# Patient Record
Sex: Male | Born: 1950 | Race: White | Hispanic: No | Marital: Married | Smoking: Former smoker
Health system: Southern US, Community
[De-identification: ages and names within clinical notes are randomized; demographics above are authoritative.]

## PROBLEM LIST (undated history)

## (undated) DIAGNOSIS — E785 Hyperlipidemia, unspecified: Secondary | ICD-10-CM

## (undated) DIAGNOSIS — I10 Essential (primary) hypertension: Secondary | ICD-10-CM

## (undated) DIAGNOSIS — F419 Anxiety disorder, unspecified: Secondary | ICD-10-CM

## (undated) DIAGNOSIS — B1920 Unspecified viral hepatitis C without hepatic coma: Secondary | ICD-10-CM

## (undated) HISTORY — DX: Hyperlipidemia, unspecified: E78.5

## (undated) HISTORY — DX: Essential (primary) hypertension: I10

## (undated) HISTORY — DX: Anxiety disorder, unspecified: F41.9

## (undated) HISTORY — DX: Unspecified viral hepatitis C without hepatic coma: B19.20

---

## 2018-01-26 DIAGNOSIS — F419 Anxiety disorder, unspecified: Secondary | ICD-10-CM | POA: Diagnosis not present

## 2018-01-26 DIAGNOSIS — G4700 Insomnia, unspecified: Secondary | ICD-10-CM | POA: Diagnosis not present

## 2018-01-26 DIAGNOSIS — M1611 Unilateral primary osteoarthritis, right hip: Secondary | ICD-10-CM | POA: Diagnosis not present

## 2018-01-26 DIAGNOSIS — I10 Essential (primary) hypertension: Secondary | ICD-10-CM | POA: Diagnosis not present

## 2018-01-28 DIAGNOSIS — R7301 Impaired fasting glucose: Secondary | ICD-10-CM | POA: Diagnosis not present

## 2018-03-09 DIAGNOSIS — E785 Hyperlipidemia, unspecified: Secondary | ICD-10-CM | POA: Diagnosis not present

## 2018-03-18 DIAGNOSIS — E785 Hyperlipidemia, unspecified: Secondary | ICD-10-CM | POA: Diagnosis not present

## 2018-03-18 DIAGNOSIS — I10 Essential (primary) hypertension: Secondary | ICD-10-CM | POA: Diagnosis not present

## 2018-08-02 DIAGNOSIS — G4700 Insomnia, unspecified: Secondary | ICD-10-CM | POA: Diagnosis not present

## 2018-08-02 DIAGNOSIS — E785 Hyperlipidemia, unspecified: Secondary | ICD-10-CM | POA: Diagnosis not present

## 2018-08-02 DIAGNOSIS — I10 Essential (primary) hypertension: Secondary | ICD-10-CM | POA: Diagnosis not present

## 2018-08-02 DIAGNOSIS — F419 Anxiety disorder, unspecified: Secondary | ICD-10-CM | POA: Diagnosis not present

## 2018-09-02 DIAGNOSIS — E785 Hyperlipidemia, unspecified: Secondary | ICD-10-CM | POA: Diagnosis not present

## 2018-09-02 DIAGNOSIS — I10 Essential (primary) hypertension: Secondary | ICD-10-CM | POA: Diagnosis not present

## 2019-01-31 DIAGNOSIS — G4700 Insomnia, unspecified: Secondary | ICD-10-CM | POA: Diagnosis not present

## 2019-01-31 DIAGNOSIS — I10 Essential (primary) hypertension: Secondary | ICD-10-CM | POA: Diagnosis not present

## 2019-01-31 DIAGNOSIS — E785 Hyperlipidemia, unspecified: Secondary | ICD-10-CM | POA: Diagnosis not present

## 2019-01-31 DIAGNOSIS — F419 Anxiety disorder, unspecified: Secondary | ICD-10-CM | POA: Diagnosis not present

## 2019-04-05 DIAGNOSIS — I10 Essential (primary) hypertension: Secondary | ICD-10-CM | POA: Diagnosis not present

## 2019-04-05 DIAGNOSIS — E785 Hyperlipidemia, unspecified: Secondary | ICD-10-CM | POA: Diagnosis not present

## 2019-08-24 DIAGNOSIS — I10 Essential (primary) hypertension: Secondary | ICD-10-CM | POA: Diagnosis not present

## 2019-08-24 DIAGNOSIS — E785 Hyperlipidemia, unspecified: Secondary | ICD-10-CM | POA: Diagnosis not present

## 2019-08-24 DIAGNOSIS — F419 Anxiety disorder, unspecified: Secondary | ICD-10-CM | POA: Diagnosis not present

## 2019-08-24 DIAGNOSIS — G4700 Insomnia, unspecified: Secondary | ICD-10-CM | POA: Diagnosis not present

## 2019-09-26 DIAGNOSIS — M25551 Pain in right hip: Secondary | ICD-10-CM | POA: Diagnosis not present

## 2019-10-04 DIAGNOSIS — E785 Hyperlipidemia, unspecified: Secondary | ICD-10-CM | POA: Diagnosis not present

## 2019-10-04 DIAGNOSIS — I10 Essential (primary) hypertension: Secondary | ICD-10-CM | POA: Diagnosis not present

## 2019-11-02 DIAGNOSIS — E7841 Elevated Lipoprotein(a): Secondary | ICD-10-CM | POA: Diagnosis not present

## 2019-11-02 DIAGNOSIS — I10 Essential (primary) hypertension: Secondary | ICD-10-CM | POA: Diagnosis not present

## 2019-11-02 DIAGNOSIS — E785 Hyperlipidemia, unspecified: Secondary | ICD-10-CM | POA: Diagnosis not present

## 2019-11-02 DIAGNOSIS — M1611 Unilateral primary osteoarthritis, right hip: Secondary | ICD-10-CM | POA: Diagnosis not present

## 2019-12-29 DIAGNOSIS — R079 Chest pain, unspecified: Secondary | ICD-10-CM | POA: Diagnosis not present

## 2020-01-02 ENCOUNTER — Ambulatory Visit: Payer: Self-pay | Admitting: Cardiology

## 2020-01-02 ENCOUNTER — Telehealth: Payer: Self-pay

## 2020-01-06 NOTE — Progress Notes (Signed)
Patient referred by Maude Leriche, PA-C for chest pain  Subjective:   Cory Hunter, male    DOB: Feb 20, 1951, 69 y.o.   MRN: [id number]   Chief Complaint  Patient presents with  . Chest Pain  . New Patient (Initial Visit)     HPI  69 y.o. Caucasian male with hypertension, hyperlipidemia, family h/o CAD, referred for evaluation of chest pain.  Patient works on rebuilding cars. For last week or so, he has noticed exertional chest pain. He was in [place] last week, where he had retrosternal chest pain, jaw and left arm numbness on walking 1/2 mile uphill. He also endorses exertional dyspnea.   He has long standing hip pain, for which he has been taking meloxicam 15 mg daily. He is currently taking Aspirin 325 mg daily. He is stain intolerant, has been on Repatha for past month and tolerating well.    Past Medical History:  Diagnosis Date  . Hyperlipidemia      History reviewed. No pertinent surgical history.   Social History   Tobacco Use  Smoking Status Former Smoker  . Packs/day: 1.00  . Years: 15.00  . Pack years: 15.00  . Types: Cigarettes  Smokeless Tobacco Never Used  Tobacco Comment   Quit in 68s    Social History   Substance and Sexual Activity  Alcohol Use Not Currently     Family History  Problem Relation Age of Onset  . Stroke Father   . Hypertension Father   . Heart attack Father        73s  . Heart attack Sister        9s     Current Outpatient Medications on File Prior to Visit  Medication Sig Dispense Refill  . amLODipine (NORVASC) 10 MG tablet Take 10 mg by mouth daily.    Marland Kitchen aspirin 325 MG tablet Take 325 mg by mouth daily.    Marland Kitchen FLUoxetine (PROZAC) 40 MG capsule Take 40 mg by mouth daily.    Marland Kitchen losartan (COZAAR) 25 MG tablet Take 25 mg by mouth daily.    . meloxicam (MOBIC) 15 MG tablet Take 15 mg by mouth daily.    Marland Kitchen REPATHA 140 MG/ML SOSY USE AS DIRECTED. INJECT ONCE EVERY 2 WEEKS SUBCUTANEOUSLY FOR 30 DAYS    .  zolpidem (AMBIEN) 5 MG tablet Take 5 mg by mouth at bedtime.     No current facility-administered medications on file prior to visit.    Cardiovascular and other pertinent studies:  EKG 01/09/2020: Sinus rhythm 70 bpm.  Probable old inferior infarct.   Recent labs: 10/04/2019: Glucose 99, BUN/Cr 16/0.91. eGFR 83. Na/K 140/4.2. Rest of the CMP normal.  H/H 16.10/46.8 HbA1C 5.7% Chol 262, TG 239, HDL 29, LDL 186   06/03/2017: TSH 1.24 normal    Review of Systems  Cardiovascular: Positive for chest pain and dyspnea on exertion. Negative for leg swelling, palpitations and syncope.  Respiratory: Positive for shortness of breath.   Musculoskeletal: Positive for joint pain (Hip).         Vitals:   01/09/20 1059  BP: 136/88  Pulse: 77  Temp: (!) 97.3 F (36.3 C)  SpO2: 97%     Body mass index is 30.24 kg/m. Filed Weights   01/09/20 1059  Weight: 223 lb (101.2 kg)     Objective:   Physical Exam  Constitutional: He appears well-developed and well-nourished.  Neck: No JVD present.  Cardiovascular: Normal rate, regular rhythm, normal heart sounds and intact  distal pulses.  No murmur heard. Pulmonary/Chest: Effort normal and breath sounds normal. He has no wheezes. He has no rales.  Musculoskeletal:        General: No edema.  Nursing note and vitals reviewed.       Assessment & Recommendations:   69 y.o. Caucasian male with hypertension, hyperlipidemia, family h/o CAD, referred for evaluation of chest pain.  Chest pain: Concering for stable angina. Recommend Aspirin 81 mg, NOT 325 mg. Recommend stopping meloxicam to reduce bleeding risk. Take tylenol instead, for chest pain.  Started Imdur 30 mg daily. Also prescribed SL NTG. Continue amlodipine, losartan. Continue Repatha.  Will obtain echocardiogram and walking/Lexiscan nuclear stress test.  Hyperlipidemia:  Statin intolerance. Continue Repatha.   F/u after above tests.   Nigel Mormon,  MD [facility] [place] Pager: [phone] Office: [phone]

## 2020-01-09 ENCOUNTER — Other Ambulatory Visit: Payer: Self-pay

## 2020-01-09 ENCOUNTER — Encounter: Payer: Self-pay | Admitting: Cardiology

## 2020-01-09 ENCOUNTER — Ambulatory Visit: Payer: Medicare Other | Admitting: Cardiology

## 2020-01-09 VITALS — BP 136/88 | HR 77 | Temp 97.3°F | Ht 72.0 in | Wt 223.0 lb

## 2020-01-09 DIAGNOSIS — I10 Essential (primary) hypertension: Secondary | ICD-10-CM

## 2020-01-09 DIAGNOSIS — R0609 Other forms of dyspnea: Secondary | ICD-10-CM | POA: Diagnosis not present

## 2020-01-09 DIAGNOSIS — Z8249 Family history of ischemic heart disease and other diseases of the circulatory system: Secondary | ICD-10-CM

## 2020-01-09 DIAGNOSIS — I208 Other forms of angina pectoris: Secondary | ICD-10-CM | POA: Insufficient documentation

## 2020-01-09 DIAGNOSIS — R0600 Dyspnea, unspecified: Secondary | ICD-10-CM | POA: Insufficient documentation

## 2020-01-09 DIAGNOSIS — F419 Anxiety disorder, unspecified: Secondary | ICD-10-CM | POA: Insufficient documentation

## 2020-01-09 DIAGNOSIS — Z789 Other specified health status: Secondary | ICD-10-CM | POA: Diagnosis not present

## 2020-01-09 DIAGNOSIS — E782 Mixed hyperlipidemia: Secondary | ICD-10-CM

## 2020-01-09 MED ORDER — ASPIRIN EC 81 MG PO TBEC: 81.0000 mg | DELAYED_RELEASE_TABLET | Freq: Every day | ORAL | 3 refills | Status: DC

## 2020-01-09 MED ORDER — NITROGLYCERIN 0.4 MG SL SUBL: 0.4000 mg | SUBLINGUAL_TABLET | SUBLINGUAL | 3 refills | Status: DC | PRN

## 2020-01-09 MED ORDER — ISOSORBIDE MONONITRATE ER 30 MG PO TB24: 30.0000 mg | ORAL_TABLET | Freq: Every day | ORAL | 3 refills | Status: DC

## 2020-01-10 ENCOUNTER — Telehealth: Payer: Self-pay

## 2020-01-10 NOTE — Telephone Encounter (Signed)
Telephone encounter:  Reason for call: pt's wife called and said that he had a very bad reaction to the Imdur, was shaking and cold sweats and very lathargic  Usual provider: MP  Last office visit: 01/09/20  Next office visit: 01/30/20   Last hospitalization: NA  Current Outpatient Medications on File Prior to Visit  Medication Sig Dispense Refill  . amLODipine (NORVASC) 10 MG tablet Take 10 mg by mouth daily.    Marland Kitchen aspirin EC 81 MG tablet Take 1 tablet (81 mg total) by mouth daily. 30 tablet 3  . FLUoxetine (PROZAC) 40 MG capsule Take 40 mg by mouth daily.    . isosorbide mononitrate (IMDUR) 30 MG 24 hr tablet Take 1 tablet (30 mg total) by mouth daily. 30 tablet 3  . losartan (COZAAR) 25 MG tablet Take 25 mg by mouth daily.    . nitroGLYCERIN (NITROSTAT) 0.4 MG SL tablet Place 1 tablet (0.4 mg total) under the tongue every 5 (five) minutes as needed for chest pain. 30 tablet 3  . REPATHA 140 MG/ML SOSY USE AS DIRECTED. INJECT ONCE EVERY 2 WEEKS SUBCUTANEOUSLY FOR 30 DAYS    . zolpidem (AMBIEN) 5 MG tablet Take 5 mg by mouth at bedtime.     No current facility-administered medications on file prior to visit.

## 2020-01-10 NOTE — Telephone Encounter (Signed)
Ok. Do not take Imdur or sublingual nitroglycerin for now. Will await workup with stress test.  Thanks MJP

## 2020-01-12 DIAGNOSIS — G4700 Insomnia, unspecified: Secondary | ICD-10-CM | POA: Diagnosis not present

## 2020-01-12 DIAGNOSIS — E785 Hyperlipidemia, unspecified: Secondary | ICD-10-CM | POA: Diagnosis not present

## 2020-01-12 DIAGNOSIS — M1611 Unilateral primary osteoarthritis, right hip: Secondary | ICD-10-CM | POA: Diagnosis not present

## 2020-01-12 DIAGNOSIS — E7849 Other hyperlipidemia: Secondary | ICD-10-CM | POA: Diagnosis not present

## 2020-01-16 ENCOUNTER — Ambulatory Visit: Payer: Medicare Other

## 2020-01-16 ENCOUNTER — Other Ambulatory Visit: Payer: Self-pay

## 2020-01-16 DIAGNOSIS — I208 Other forms of angina pectoris: Secondary | ICD-10-CM

## 2020-01-18 NOTE — Progress Notes (Signed)
Thanks

## 2020-01-18 NOTE — Progress Notes (Signed)
I reviewed the stress test. I Please check if he could see me on 4/5, a week earlier.   Thanks MJP

## 2020-01-23 ENCOUNTER — Telehealth: Payer: Medicare Other | Admitting: Cardiology

## 2020-01-23 ENCOUNTER — Encounter: Payer: Self-pay | Admitting: Cardiology

## 2020-01-23 VITALS — BP 157/99 | HR 66

## 2020-01-23 DIAGNOSIS — R0600 Dyspnea, unspecified: Secondary | ICD-10-CM

## 2020-01-23 DIAGNOSIS — I208 Other forms of angina pectoris: Secondary | ICD-10-CM

## 2020-01-23 DIAGNOSIS — R0609 Other forms of dyspnea: Secondary | ICD-10-CM | POA: Diagnosis not present

## 2020-01-23 DIAGNOSIS — Z789 Other specified health status: Secondary | ICD-10-CM | POA: Diagnosis not present

## 2020-01-23 DIAGNOSIS — E782 Mixed hyperlipidemia: Secondary | ICD-10-CM

## 2020-01-23 DIAGNOSIS — Z8249 Family history of ischemic heart disease and other diseases of the circulatory system: Secondary | ICD-10-CM

## 2020-01-23 DIAGNOSIS — I10 Essential (primary) hypertension: Secondary | ICD-10-CM

## 2020-01-23 MED ORDER — METOPROLOL SUCCINATE ER 50 MG PO TB24: 50.0000 mg | ORAL_TABLET | Freq: Every day | ORAL | 2 refills | Status: DC

## 2020-01-23 MED ORDER — PANTOPRAZOLE SODIUM 40 MG PO TBEC: 40.0000 mg | DELAYED_RELEASE_TABLET | Freq: Every day | ORAL | 2 refills | Status: DC

## 2020-01-23 NOTE — Progress Notes (Signed)
Patient referred by Maude Leriche, PA-C for chest pain  Subjective:   Cory Hunter, male    DOB: 08-Jan-1951, 69 y.o.   MRN: [id number]  I connected with the patient on 01/23/2020 by a telephone call and verified that I am speaking with the correct person using two identifiers.     I offered the patient a video enabled application for a virtual visit. Unfortunately, this could not be accomplished due to technical difficulties/lack of video enabled phone/computer. I discussed the limitations of evaluation and management by telemedicine and the availability of in person appointments. The patient expressed understanding and agreed to proceed.   This visit type was conducted due to national recommendations for restrictions regarding the COVID-19 Pandemic (e.g. social distancing).  This format is felt to be most appropriate for this patient at this time.  All issues noted in this document were discussed and addressed.  No physical exam was performed (except for noted visual exam findings with Tele health visits).  The patient has consented to conduct a Tele health visit and understands insurance will be billed.    Chief Complaint  Patient presents with  . Chest Pain     HPI  69 y.o. Caucasian male with hypertension, hyperlipidemia, family h/o CAD, with exertional chest pain.  Since his last visit, he has not had any chest pain or shortness of breath symptoms. 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 He states that this involves physical exertion. He has not done the kind of physical activity that brought the pain in first place- walking up the hill while vacationing in [place].  Stress test showed medium sized, medium severe intensity, completely reversible perfusion defect in apical to mid anterolateral myocardium. In addition, there is a small sized, mild intensity, reversible perfusion defect in basal inferoseptal myocardium. Stress LVEF  52% TID index is 1.20, indicating high risk study.  Patient did not tolerate isosorbide monohydrate due to feeling very lethargic and diaphoretic.   Initial consultation HPI 12/2018: Patient works on rebuilding cars. For last week or so, he has noticed exertional chest pain. He was in [place] last week, where he had retrosternal chest pain, jaw and left arm numbness on walking 1/2 mile uphill. He also endorses exertional dyspnea.   He has long standing hip pain, for which he has been taking meloxicam 15 mg daily. He is currently taking Aspirin 325 mg daily. He is stain intolerant, has been on Repatha for past month and tolerating well.    Current Outpatient Medications on File Prior to Visit  Medication Sig Dispense Refill  . amLODipine (NORVASC) 10 MG tablet Take 10 mg by mouth daily.    Marland Kitchen aspirin EC 81 MG tablet Take 1 tablet (81 mg total) by mouth daily. 30 tablet 3  . FLUoxetine (PROZAC) 40 MG capsule Take 40 mg by mouth daily.    Marland Kitchen losartan (COZAAR) 25 MG tablet Take 25 mg by mouth daily.    Marland Kitchen REPATHA 140 MG/ML SOSY USE AS DIRECTED. INJECT ONCE EVERY 2 WEEKS SUBCUTANEOUSLY FOR 30 DAYS    . zolpidem (AMBIEN) 5 MG tablet Take 5 mg by mouth at bedtime.     No current facility-administered medications on file prior to visit.    Cardiovascular and other pertinent studies:  Echocardiogram 01/16/2020:  Left ventricle cavity is normal in size. Mild concentric hypertrophy of  the left ventricle. Normal global wall motion. Normal LV systolic function  with EF 67%. Doppler evidence of  grade I (impaired) diastolic dysfunction,  normal LAP.  Left atrial cavity is mildly dilated.  Mild (Grade I) mitral regurgitation.  Inadequate TR jet to estimate pulmonary artery systolic pressure. Normal  right atrial pressure.  Lexiscan Tetrofosmin stress test 01/16/2020: No previous exam available for comparison. Lexiscan nuclear stress test performed using 1-day protocol. Stress EKG is non-diagnostic,  as this is pharmacological stress test. In addition, stress EKG showed <1 mm horizontal ST depressions in inferolateral leads.  Medium sized, medium severe intensity, completely reversible perfusion defect in apical to mid anterolateral myocardium. In addition, there is a small sized, mild intensity, reversible perfusion defect in basal inferoseptal myocardium. Stress LVEF 52% TID index is 1.20. High risk study.   EKG 01/09/2020: Sinus rhythm 70 bpm.  Probable old inferior infarct.   Recent labs: 10/04/2019: Glucose 99, BUN/Cr 16/0.91. eGFR 83. Na/K 140/4.2. Rest of the CMP normal.  H/H 16.1/46.8 HbA1C 5.7% Chol 262, TG 239, HDL 29, LDL 186   06/03/2017: TSH 1.24 normal     Review of Systems  Cardiovascular: Positive for chest pain and dyspnea on exertion. Negative for leg swelling, palpitations and syncope.  Respiratory: Positive for shortness of breath.   Musculoskeletal: Positive for joint pain (Hip).         Vitals:   01/23/20 1046  BP: (!) 157/99  Pulse: 66     Objective:   Physical Exam  Not performed. Telephone visit.      Assessment & Recommendations:   69 y.o. Caucasian male with hypertension, hyperlipidemia, family h/o CAD, worsening stable angina.  Stable angina:  While recent stress test showed high risk findings, he has had no recurrence of symptoms since last visit.  Continue aspirin 81 mg daily, amlodipine, Repatha. SL NTG as needed. Switch losartan 25 mg to metoprolol succinate 50 mg for blood pressure control, as well as anti anginal benefits. Recommend taking PPI, as he states that he has to take meloxicam for his hip pain. Should he have worsening chest pain, he knows to contact me sooner. Should he have chest pain not relieved with two SL NTG, hw knows to call 911.  Hyperlipidemia:  Statin intolerance. Continue Repatha.  Check lipid panel.  F/u in June 2021  Nigel Mormon, MD [facility]. [place] Pager:  [phone] Office: [phone]

## 2020-01-30 ENCOUNTER — Telehealth: Payer: Medicare Other | Admitting: Cardiology

## 2020-01-30 DIAGNOSIS — M1611 Unilateral primary osteoarthritis, right hip: Secondary | ICD-10-CM | POA: Diagnosis not present

## 2020-01-30 DIAGNOSIS — E782 Mixed hyperlipidemia: Secondary | ICD-10-CM | POA: Diagnosis not present

## 2020-01-31 LAB — LIPID PANEL
Chol/HDL Ratio: 4.7 ratio (ref 0.0–5.0)
Cholesterol, Total: 126 mg/dL (ref 100–199)
HDL: 27 mg/dL — ABNORMAL LOW (ref >39)
LDL Chol Calc (NIH): 72 mg/dL (ref 0–99)
Triglycerides: 156 mg/dL — ABNORMAL HIGH (ref 0–149)
VLDL Cholesterol Cal: 27 mg/dL (ref 5–40)

## 2020-02-08 ENCOUNTER — Telehealth: Payer: Self-pay

## 2020-02-08 NOTE — Telephone Encounter (Signed)
I took him off his daily meloxicam, as it would increase his risk of GI bleeding, while on Aspirin. Tylenol is the best bet to avoid bleeding risk. If that is not acceptable, okay to take meloxicam for no until he finds an alternate option after discussing with his PCP.   Thanks MJP

## 2020-03-12 DIAGNOSIS — E7841 Elevated Lipoprotein(a): Secondary | ICD-10-CM | POA: Diagnosis not present

## 2020-03-12 DIAGNOSIS — G4700 Insomnia, unspecified: Secondary | ICD-10-CM | POA: Diagnosis not present

## 2020-03-12 DIAGNOSIS — I10 Essential (primary) hypertension: Secondary | ICD-10-CM | POA: Diagnosis not present

## 2020-03-12 DIAGNOSIS — E785 Hyperlipidemia, unspecified: Secondary | ICD-10-CM | POA: Diagnosis not present

## 2020-03-22 NOTE — Progress Notes (Signed)
Follow up visit  Subjective:   Cory Hunter, male    DOB: [birth year], 69 y.o.   MRN: [id number]     HPI   Chief Complaint  Patient presents with  . Chest Pain  . Follow-up    6  week    69 y.o. Caucasian male  hypertension, hyperlipidemia, family h/o CAD with stable angina, abnormal stress test   Patient has not had any recurrent chest pain symptoms on current medical therapy.  That said, his physical activity is significantly limited due to his hip pain.  LDL is improved.  Initial consultation HPI 12/2018: Patient works on rebuilding cars. For last week or so, he has noticed exertional chest pain. He was in [place] last week, where he had retrosternal chest pain, jaw and left arm numbness on walking 1/2 mile uphill. He also endorses exertional dyspnea.   He has long standing hip pain, for which he has been taking meloxicam 15 mg daily. He is currently taking Aspirin 325 mg daily. He is stain intolerant, has been on Repatha for past month and tolerating well.      Current Outpatient Medications on File Prior to Visit  Medication Sig Dispense Refill  . amLODipine (NORVASC) 10 MG tablet Take 10 mg by mouth daily.    Marland Kitchen aspirin EC 81 MG tablet Take 1 tablet (81 mg total) by mouth daily. 30 tablet 3  . FLUoxetine (PROZAC) 40 MG capsule Take 40 mg by mouth daily.    . metoprolol succinate (TOPROL-XL) 50 MG 24 hr tablet Take 1 tablet (50 mg total) by mouth daily. Take with or immediately following a meal. 30 tablet 2  . nitroGLYCERIN (NITROSTAT) 0.4 MG SL tablet Place 0.4 mg under the tongue every 5 (five) minutes as needed for chest pain.    . pantoprazole (PROTONIX) 40 MG tablet Take 1 tablet (40 mg total) by mouth daily. 30 tablet 2  . REPATHA 140 MG/ML SOSY USE AS DIRECTED. INJECT ONCE EVERY 2 WEEKS SUBCUTANEOUSLY FOR 30 DAYS    . zolpidem (AMBIEN) 5 MG tablet Take 5 mg by mouth at bedtime.     No current facility-administered medications on file prior to visit.     Cardiovascular & other pertient studies:  Echocardiogram 01/16/2020:  Left ventricle cavity is normal in size. Mild concentric hypertrophy of the left ventricle.  Normal global wall motion. Normal LV systolic function  with EF 67%. Doppler evidence of grade I (impaired) diastolic dysfunction, normal LAP.  Left atrial cavity is mildly dilated.  Mild (Grade I) mitral regurgitation.  Inadequate TR jet to estimate pulmonary artery systolic pressure. Normal right atrial pressure.   Lexiscan Tetrofosmin stress test 01/16/2020: No previous exam available for comparison. Lexiscan nuclear stress test performed using 1-day protocol.  Stress EKG is non-diagnostic, as this is pharmacological stress test.  In addition, stress EKG showed <1 mm horizontal ST depressions in inferolateral leads.  Medium sized, medium severe intensity, completely reversible perfusion defect in apical to mid anterolateral myocardium.  In addition, there is a small sized, mild intensity, reversible perfusion defect in basal inferoseptal myocardium.  Stress LVEF 52% TID index is 1.20. High risk study.    EKG 01/09/2020: Sinus rhythm 70 bpm.  Probable old inferior infarct.    Recent labs: 4/12/20221: Chol 126, TG 156, HDL 27, LDL 72  10/04/2019: Glucose 99, BUN/Cr 16/0.91. eGFR 83. Na/K 140/4.2. Rest of the CMP normal.  H/H 16.1/46.8 HbA1C 5.7% Chol 262, TG 239, HDL 29, LDL 186  06/03/2017: TSH 1.24 normal     Review of Systems  Cardiovascular: Negative for chest pain, dyspnea on exertion, leg swelling, palpitations and syncope.  Respiratory: Negative for shortness of breath.   Musculoskeletal: Positive for joint pain (Hip).         Vitals:   03/23/20 1021  BP: 131/79  Pulse: 64  Resp: 16  SpO2: 92%    Body mass index is 30.65 kg/m. Filed Weights   03/23/20 1021  Weight: 226 lb (102.5 kg)     Objective:   Physical Exam  Constitutional: No distress.  Neck: No JVD present.   Cardiovascular: Normal rate, regular rhythm, normal heart sounds and intact distal pulses.  No murmur heard. Pulmonary/Chest: Effort normal and breath sounds normal. He has no wheezes. He has no rales.  Musculoskeletal:        General: No edema.  Nursing note and vitals reviewed.         Assessment & Recommendations:    69 y.o. Caucasian male  hypertension, hyperlipidemia, family h/o CAD with stable angina, abnormal stress test   Stable angina:  While recent stress test showed high risk findings, he has had no recurrence of symptoms since last visit.  I suspect this is due to his severely limited physical activity due to hip pain. After detailed discussion regarding further management options, patient is opted to continue medical management without further invasive evaluation.  However, should patient decide to undergo hip surgery in the near future, he will likely need cardiac catheterization for cardiac risk stratification.  I explained to the patient that should he need to undergo revascularization for severe CAD and stable angina, he will likely need at least 3-6 months of uninterrupted dual antiplatelet therapy.  This will likely delay his hip surgery at that time.  Patient and wife verbalized understanding. Continue aspirin 81 mg daily, metoprolol succinate, amlodipine, Repatha. SL NTG as needed.  Hypertension: Well-controlled  Hyperlipidemia:  Statin intolerance. Continue Repatha.  LDL down from 186 to 72  F/u in 6 months    [place], MD [facility] [place] Pager: [phone] Office: [phone]

## 2020-03-23 ENCOUNTER — Encounter: Payer: Self-pay | Admitting: Cardiology

## 2020-03-23 ENCOUNTER — Ambulatory Visit: Payer: Medicare Other | Admitting: Cardiology

## 2020-03-23 ENCOUNTER — Other Ambulatory Visit: Payer: Self-pay

## 2020-03-23 VITALS — BP 131/79 | HR 64 | Resp 16 | Ht 72.0 in | Wt 226.0 lb

## 2020-03-23 DIAGNOSIS — I208 Other forms of angina pectoris: Secondary | ICD-10-CM | POA: Diagnosis not present

## 2020-03-23 DIAGNOSIS — E782 Mixed hyperlipidemia: Secondary | ICD-10-CM | POA: Diagnosis not present

## 2020-03-23 DIAGNOSIS — G4700 Insomnia, unspecified: Secondary | ICD-10-CM | POA: Diagnosis not present

## 2020-03-23 DIAGNOSIS — I10 Essential (primary) hypertension: Secondary | ICD-10-CM | POA: Diagnosis not present

## 2020-03-23 DIAGNOSIS — E785 Hyperlipidemia, unspecified: Secondary | ICD-10-CM | POA: Diagnosis not present

## 2020-03-23 DIAGNOSIS — M1611 Unilateral primary osteoarthritis, right hip: Secondary | ICD-10-CM | POA: Diagnosis not present

## 2020-03-23 MED ORDER — NITROGLYCERIN 0.4 MG SL SUBL: 0.4000 mg | SUBLINGUAL_TABLET | SUBLINGUAL | 3 refills | Status: DC | PRN

## 2020-03-23 MED ORDER — AMLODIPINE BESYLATE 10 MG PO TABS: 10.0000 mg | ORAL_TABLET | Freq: Every day | ORAL | 3 refills | Status: AC

## 2020-03-23 MED ORDER — METOPROLOL SUCCINATE ER 50 MG PO TB24: 50.0000 mg | ORAL_TABLET | Freq: Every day | ORAL | 3 refills | Status: DC

## 2020-03-23 MED ORDER — ASPIRIN EC 81 MG PO TBEC: 81.0000 mg | DELAYED_RELEASE_TABLET | Freq: Every day | ORAL | 3 refills | Status: AC

## 2020-04-17 ENCOUNTER — Other Ambulatory Visit: Payer: Self-pay | Admitting: Cardiology

## 2020-06-04 DIAGNOSIS — I10 Essential (primary) hypertension: Secondary | ICD-10-CM | POA: Diagnosis not present

## 2020-06-04 DIAGNOSIS — M1611 Unilateral primary osteoarthritis, right hip: Secondary | ICD-10-CM | POA: Diagnosis not present

## 2020-06-04 DIAGNOSIS — E7841 Elevated Lipoprotein(a): Secondary | ICD-10-CM | POA: Diagnosis not present

## 2020-06-04 DIAGNOSIS — G4700 Insomnia, unspecified: Secondary | ICD-10-CM | POA: Diagnosis not present

## 2020-07-06 DIAGNOSIS — G4700 Insomnia, unspecified: Secondary | ICD-10-CM | POA: Diagnosis not present

## 2020-07-06 DIAGNOSIS — I10 Essential (primary) hypertension: Secondary | ICD-10-CM | POA: Diagnosis not present

## 2020-07-06 DIAGNOSIS — E7841 Elevated Lipoprotein(a): Secondary | ICD-10-CM | POA: Diagnosis not present

## 2020-07-06 DIAGNOSIS — M1611 Unilateral primary osteoarthritis, right hip: Secondary | ICD-10-CM | POA: Diagnosis not present

## 2020-07-30 DIAGNOSIS — R6889 Other general symptoms and signs: Secondary | ICD-10-CM | POA: Diagnosis not present

## 2020-07-31 DIAGNOSIS — R519 Headache, unspecified: Secondary | ICD-10-CM | POA: Diagnosis not present

## 2020-07-31 DIAGNOSIS — R059 Cough, unspecified: Secondary | ICD-10-CM | POA: Diagnosis not present

## 2020-07-31 DIAGNOSIS — R6889 Other general symptoms and signs: Secondary | ICD-10-CM | POA: Diagnosis not present

## 2020-07-31 DIAGNOSIS — R110 Nausea: Secondary | ICD-10-CM | POA: Diagnosis not present

## 2020-08-14 DIAGNOSIS — M160 Bilateral primary osteoarthritis of hip: Secondary | ICD-10-CM | POA: Diagnosis not present

## 2020-08-14 DIAGNOSIS — M25551 Pain in right hip: Secondary | ICD-10-CM | POA: Diagnosis not present

## 2020-08-30 DIAGNOSIS — Z791 Long term (current) use of non-steroidal anti-inflammatories (NSAID): Secondary | ICD-10-CM | POA: Diagnosis not present

## 2020-08-30 DIAGNOSIS — Z7982 Long term (current) use of aspirin: Secondary | ICD-10-CM | POA: Diagnosis not present

## 2020-08-30 DIAGNOSIS — I10 Essential (primary) hypertension: Secondary | ICD-10-CM | POA: Diagnosis not present

## 2020-08-30 DIAGNOSIS — M1611 Unilateral primary osteoarthritis, right hip: Secondary | ICD-10-CM | POA: Diagnosis not present

## 2020-09-27 ENCOUNTER — Ambulatory Visit: Payer: Medicare Other | Admitting: Cardiology

## 2020-09-27 ENCOUNTER — Encounter: Payer: Self-pay | Admitting: Cardiology

## 2020-09-27 ENCOUNTER — Other Ambulatory Visit: Payer: Self-pay

## 2020-09-27 VITALS — BP 148/90 | HR 66 | Resp 16 | Ht 72.0 in | Wt 229.0 lb

## 2020-09-27 DIAGNOSIS — E782 Mixed hyperlipidemia: Secondary | ICD-10-CM

## 2020-09-27 DIAGNOSIS — Z789 Other specified health status: Secondary | ICD-10-CM

## 2020-09-27 DIAGNOSIS — I10 Essential (primary) hypertension: Secondary | ICD-10-CM

## 2020-09-27 DIAGNOSIS — I208 Other forms of angina pectoris: Secondary | ICD-10-CM

## 2020-09-27 NOTE — Progress Notes (Signed)
Follow up visit  Subjective:   Cory Hunter, male    DOB: [birth year], 69 y.o.   MRN: [id number]   HPI   Chief Complaint  Patient presents with  . Hypertension  . Follow-up    69 y.o. Caucasian male  hypertension, hyperlipidemia, family h/o CAD with stable angina, abnormal stress test   Patient has not had any recurrent chest pain symptoms on current medical therapy.  His physical activity is significantly limited due to his hip pain.  For last few days, he has had left elbow pain with certain movement, not worse with exertion. He is still contemplating undergoing hip surgery at some point. Blood pressure is elevated today. He does not check it regularly.   Initial consultation HPI 12/2018: Patient works on rebuilding cars. For last week or so, he has noticed exertional chest pain. He was in [place] last week, where he had retrosternal chest pain, jaw and left arm numbness on walking 1/2 mile uphill. He also endorses exertional dyspnea.   He has long standing hip pain, for which he has been taking meloxicam 15 mg daily. He is currently taking Aspirin 325 mg daily. He is stain intolerant, has been on Repatha for past month and tolerating well.      Current Outpatient Medications on File Prior to Visit  Medication Sig Dispense Refill  . amLODipine (NORVASC) 10 MG tablet Take 1 tablet (10 mg total) by mouth daily. 90 tablet 3  . aspirin EC 81 MG tablet Take 1 tablet (81 mg total) by mouth daily. 90 tablet 3  . FLUoxetine (PROZAC) 40 MG capsule Take 40 mg by mouth daily.    . metoprolol succinate (TOPROL-XL) 50 MG 24 hr tablet Take 1 tablet (50 mg total) by mouth daily. Take with or immediately following a meal. 90 tablet 3  . nitroGLYCERIN (NITROSTAT) 0.4 MG SL tablet Place 1 tablet (0.4 mg total) under the tongue every 5 (five) minutes as needed for chest pain. 30 tablet 3  . pantoprazole (PROTONIX) 40 MG tablet Take 1 tablet by mouth once daily 90 tablet 3  . REPATHA 140  MG/ML SOSY USE AS DIRECTED. INJECT ONCE EVERY 2 WEEKS SUBCUTANEOUSLY FOR 30 DAYS    . zolpidem (AMBIEN) 5 MG tablet Take 5 mg by mouth at bedtime.     No current facility-administered medications on file prior to visit.    Cardiovascular & other pertient studies:  EKG 09/27/2020: Sinus rhythm 63 bpm Nonspecific T wave changes  Echocardiogram 01/16/2020:  Left ventricle cavity is normal in size. Mild concentric hypertrophy of the left ventricle.  Normal global wall motion. Normal LV systolic function  with EF 67%. Doppler evidence of grade I (impaired) diastolic dysfunction, normal LAP.  Left atrial cavity is mildly dilated.  Mild (Grade I) mitral regurgitation.  Inadequate TR jet to estimate pulmonary artery systolic pressure. Normal right atrial pressure.   Lexiscan Tetrofosmin stress test 01/16/2020: No previous exam available for comparison. Lexiscan nuclear stress test performed using 1-day protocol.  Stress EKG is non-diagnostic, as this is pharmacological stress test.  In addition, stress EKG showed <1 mm horizontal ST depressions in inferolateral leads.  Medium sized, medium severe intensity, completely reversible perfusion defect in apical to mid anterolateral myocardium.  In addition, there is a small sized, mild intensity, reversible perfusion defect in basal inferoseptal myocardium.  Stress LVEF 52% TID index is 1.20. High risk study.    EKG 01/09/2020: Sinus rhythm 70 bpm.  Probable old inferior infarct.  Recent labs: 4/12/20221: Chol 126, TG 156, HDL 27, LDL 72  10/04/2019: Glucose 99, BUN/Cr 16/0.91. eGFR 83. Na/K 140/4.2. Rest of the CMP normal.  H/H 16.1/46.8 HbA1C 5.7% Chol 262, TG 239, HDL 29, LDL 186   06/03/2017: TSH 1.24 normal     Review of Systems  Cardiovascular: Negative for chest pain, dyspnea on exertion, leg swelling, palpitations and syncope.  Respiratory: Negative for shortness of breath.   Musculoskeletal: Positive for joint pain  (Hip).         Vitals:   09/27/20 0923  BP: (!) 148/90  Pulse: 66  Resp: 16  SpO2: 96%     Body mass index is 31.06 kg/m. Filed Weights   09/27/20 0923  Weight: 229 lb (103.9 kg)     Objective:   Physical Exam Vitals and nursing note reviewed.  Constitutional:      General: He is not in acute distress. Neck:     Vascular: No JVD.  Cardiovascular:     Rate and Rhythm: Normal rate and regular rhythm.     Pulses: Intact distal pulses.     Heart sounds: Normal heart sounds. No murmur heard.   Pulmonary:     Effort: Pulmonary effort is normal.     Breath sounds: Normal breath sounds. No wheezing or rales.           Assessment & Recommendations:    69 y.o. Caucasian male  hypertension, hyperlipidemia, family h/o CAD with stable angina, abnormal stress test   CAD with stable angina:  Stress test in 12/2019 showed high risk findings. He currently has no significant angina symptoms. However, his physical activity is limited due to hip pain. I do not think his left elbow pain is angina. After detailed discussion regarding further management options, patient is opted to continue medical management without further invasive evaluation.  However, should patient decide to undergo hip surgery in the near future, he will likely need cardiac catheterization for cardiac risk stratification.  I explained to the patient that should he need to undergo revascularization for severe CAD and stable angina, he will likely need at least 3-6 months of uninterrupted dual antiplatelet therapy.  This will likely delay his hip surgery at that time.  Patient and wife verbalized understanding. Continue aspirin 81 mg daily, metoprolol succinate, amlodipine, Repatha. SL NTG as needed.  Hypertension: BP elevated today. Recommend regular home monitoring and f/u in 6-8 weeks to re-evaluate.  Hyperlipidemia:  Statin intolerance. Continue Repatha.  LDL down from 186 to [place], MD [facility] [place] Pager: [phone] Office: [phone]

## 2020-10-03 ENCOUNTER — Other Ambulatory Visit: Payer: Self-pay | Admitting: Cardiology

## 2020-10-03 DIAGNOSIS — I208 Other forms of angina pectoris: Secondary | ICD-10-CM

## 2020-10-03 DIAGNOSIS — R0609 Other forms of dyspnea: Secondary | ICD-10-CM

## 2020-10-03 DIAGNOSIS — R0600 Dyspnea, unspecified: Secondary | ICD-10-CM

## 2020-10-11 ENCOUNTER — Other Ambulatory Visit (HOSPITAL_COMMUNITY): Payer: Medicare Other

## 2020-10-16 ENCOUNTER — Encounter (HOSPITAL_COMMUNITY): Admission: RE | Payer: Self-pay | Source: Home / Self Care

## 2020-10-16 ENCOUNTER — Ambulatory Visit (HOSPITAL_COMMUNITY): Admission: RE | Admit: 2020-10-16 | Payer: Medicare Other | Source: Home / Self Care | Admitting: Cardiology

## 2020-10-16 SURGERY — LEFT HEART CATH AND CORONARY ANGIOGRAPHY
Anesthesia: LOCAL

## 2020-10-31 DIAGNOSIS — I10 Essential (primary) hypertension: Secondary | ICD-10-CM | POA: Diagnosis not present

## 2020-10-31 DIAGNOSIS — G4700 Insomnia, unspecified: Secondary | ICD-10-CM | POA: Diagnosis not present

## 2020-10-31 DIAGNOSIS — E785 Hyperlipidemia, unspecified: Secondary | ICD-10-CM | POA: Diagnosis not present

## 2020-10-31 DIAGNOSIS — F419 Anxiety disorder, unspecified: Secondary | ICD-10-CM | POA: Diagnosis not present

## 2020-11-22 DIAGNOSIS — F419 Anxiety disorder, unspecified: Secondary | ICD-10-CM | POA: Diagnosis not present

## 2020-11-22 DIAGNOSIS — I10 Essential (primary) hypertension: Secondary | ICD-10-CM | POA: Diagnosis not present

## 2020-11-22 DIAGNOSIS — G4719 Other hypersomnia: Secondary | ICD-10-CM | POA: Diagnosis not present

## 2020-11-22 DIAGNOSIS — E669 Obesity, unspecified: Secondary | ICD-10-CM | POA: Diagnosis not present

## 2020-11-27 NOTE — Progress Notes (Signed)
Follow up visit  Subjective:   Cory Hunter, male    DOB: 1951-05-06, 70 y.o.   MRN: [id number]   HPI   Chief Complaint  Patient presents with  . Hypertension  . Follow-up    2 MONTH    70 y.o. Caucasian male  hypertension, hyperlipidemia, family h/o CAD with stable angina, abnormal stress test   Paitent presents for 2 month follow up of hypertension. At last visit no changes were made to his medications.  Patient has had no recurrence of chest pain symptoms, although physical activity is limited secondary to hip pain.  Unfortunately patient has not been monitoring his blood pressure on a regular basis at home, however the few readings he can provide are above goal. Patient has chosen to go forward with hip surgery, therefore he would like to proceed with cardiac catheterization prior to the surgery.  Patient denies chest pain, palpitations, dyspnea or dizziness, leg swelling.  He denies orthopnea, PND, syncope, near syncope.  Patient admits to diet high in fat and salt.   Current Outpatient Medications on File Prior to Visit  Medication Sig Dispense Refill  . acetaminophen (TYLENOL) 650 MG CR tablet Take 650 mg by mouth daily. 2 TABLETS TWICE DAILY    . amLODipine (NORVASC) 10 MG tablet Take 1 tablet (10 mg total) by mouth daily. 90 tablet 3  . aspirin EC 81 MG tablet Take 1 tablet (81 mg total) by mouth daily. 90 tablet 3  . FLUoxetine (PROZAC) 40 MG capsule Take 40 mg by mouth daily.    . Melatonin 10 MG TABS Take 10 mg by mouth daily.    . nitroGLYCERIN (NITROSTAT) 0.4 MG SL tablet Place 1 tablet (0.4 mg total) under the tongue every 5 (five) minutes as needed for chest pain. 30 tablet 3  . pantoprazole (PROTONIX) 40 MG tablet Take 1 tablet by mouth once daily 90 tablet 3  . REPATHA 140 MG/ML SOSY USE AS DIRECTED. INJECT ONCE EVERY 2 WEEKS SUBCUTANEOUSLY FOR 30 DAYS    . zolpidem (AMBIEN) 5 MG tablet Take 5 mg by mouth at bedtime.    . melatonin 5 MG TABS 1 tablet in the  evening     No current facility-administered medications on file prior to visit.    Cardiovascular & other pertient studies:  EKG 09/27/2020: Sinus rhythm 63 bpm Nonspecific T wave changes  Echocardiogram 01/16/2020:  Left ventricle cavity is normal in size. Mild concentric hypertrophy of the left ventricle.  Normal global wall motion. Normal LV systolic function  with EF 67%. Doppler evidence of grade I (impaired) diastolic dysfunction, normal LAP.  Left atrial cavity is mildly dilated.  Mild (Grade I) mitral regurgitation.  Inadequate TR jet to estimate pulmonary artery systolic pressure. Normal right atrial pressure.   Lexiscan Tetrofosmin stress test 01/16/2020: No previous exam available for comparison. Lexiscan nuclear stress test performed using 1-day protocol.  Stress EKG is non-diagnostic, as this is pharmacological stress test.  In addition, stress EKG showed <1 mm horizontal ST depressions in inferolateral leads.  Medium sized, medium severe intensity, completely reversible perfusion defect in apical to mid anterolateral myocardium.  In addition, there is a small sized, mild intensity, reversible perfusion defect in basal inferoseptal myocardium.  Stress LVEF 52% TID index is 1.20. High risk study.    EKG 01/09/2020: Sinus rhythm 70 bpm.  Probable old inferior infarct.    Recent labs: 10/31/2020: Hemoglobin 16.5, hematocrit 40.1, MCV 97.8, platelet 230 Total cholesterol 163, triglycerides 229, HDL  29, LDL 95, non-HDL 134 Glucose 96, BUN 17, creatinine 1.12, GFR 65, sodium 139, potassium 4.8  4/12/20221: Chol 126, TG 156, HDL 27, LDL 72  10/04/2019: Glucose 99, BUN/Cr 16/0.91. eGFR 83. Na/K 140/4.2. Rest of the CMP normal.  H/H 16.1/46.8 HbA1C 5.7% Chol 262, TG 239, HDL 29, LDL 186   06/03/2017: TSH 1.24 normal     Review of Systems  Constitutional: Negative for malaise/fatigue and weight gain.  Cardiovascular: Negative for chest pain, claudication,  dyspnea on exertion, leg swelling, near-syncope, orthopnea, palpitations, paroxysmal nocturnal dyspnea and syncope.  Respiratory: Negative for shortness of breath.   Hematologic/Lymphatic: Does not bruise/bleed easily.  Musculoskeletal: Positive for joint pain (Hip).  Gastrointestinal: Negative for melena.  Neurological: Negative for dizziness and weakness.          Vitals:   11/28/20 0941  BP: (!) 155/94  Pulse: (!) 59  Resp: 16  Temp: 97.6 F (36.4 C)  SpO2: 97%     Body mass index is 31.71 kg/m. Filed Weights   11/28/20 0941  Weight: 233 lb 12.8 oz (106.1 kg)     Objective:   Physical Exam Vitals and nursing note reviewed.  Constitutional:      General: He is not in acute distress. HENT:     Head: Normocephalic and atraumatic.  Neck:     Vascular: No JVD.  Cardiovascular:     Rate and Rhythm: Normal rate and regular rhythm.     Pulses: Intact distal pulses.     Heart sounds: Normal heart sounds, S1 normal and S2 normal. No murmur heard. No gallop.   Pulmonary:     Effort: Pulmonary effort is normal. No respiratory distress.     Breath sounds: Normal breath sounds. No wheezing, rhonchi or rales.  Musculoskeletal:     Right lower leg: No edema.     Left lower leg: No edema.  Skin:    General: Skin is warm and dry.  Neurological:     Mental Status: He is alert.         Assessment & Recommendations:    70 y.o. Caucasian male  hypertension, hyperlipidemia, family h/o CAD with stable angina, abnormal stress test   CAD with stable angina:  Stress test in 12/2019 showed high risk findings. Patient has no anginal symptoms, although his physical activity is significantly limited secondary to pain in his hip.  Patient had previously opted to continue with medical management of coronary artery disease, however he has decided to proceed with hip surgery and therefore recommend he undergo cardiac catheterization for cardiac risk stratification.  Patient is aware  that if he undergoes revascularization he will need uninterrupted dual antiplatelet therapy for 3 to 6 months, which will further delay his hip surgery. The heart catheterization procedure was explained to the patient in detail. The indication, alternatives, risks and benefits were reviewed. Complications including but not limited to bleeding, infection, acute kidney injury, blood transfusion, heart rhythm disturbances, contrast (dye) reaction, damage to the arteries or nerves in the legs or hands, cerebrovascular accident, myocardial infarction, need for emergent bypass surgery, blood clots in the legs, possible need for emergent blood transfusion, and rarely death were reviewed and discussed with the patient. The patient voices understanding and wishes to proceed. Continue vascular medications including aspirin, metoprolol succinate, amlodipine, Repatha, and as needed sublingual nitroglycerin.  Hypertension: Blood pressure remains uncontrolled as it is elevated in the office as well as on home monitor readings.  We will continue current antihypertensive medications.  We will restart losartan 25 mg daily which she has been on in the past.  Repeat BMP in 7 to 10 days. Patient will monitor his blood pressure at home on a daily basis and bring with him a log to his next office visit.  Hyperlipidemia:  Patient has history of statin intolerance due to significant myalgias.  He is presently tolerating Repatha well. Continue Repatha. I personally reviewed external labs triglycerides are elevated.  Discussed at length regarding diet and lifestyle modifications.  Patient prefers to attempt diet and lifestyle modifications prior to medication adjustments. Will recheck lipid profile in 3 months.   This was a 35-minute encounter with face-to-face counseling, medical records review, coordination of care, explanation of complex medical issues, complex medical decision making.     Alethia Berthold,  PA-C 11/28/2020, 2:15 PM Office: [phone]

## 2020-11-28 ENCOUNTER — Ambulatory Visit: Payer: Medicare Other | Admitting: Student

## 2020-11-28 ENCOUNTER — Other Ambulatory Visit: Payer: Self-pay

## 2020-11-28 ENCOUNTER — Encounter: Payer: Self-pay | Admitting: Student

## 2020-11-28 VITALS — BP 155/94 | HR 59 | Temp 97.6°F | Resp 16 | Ht 72.0 in | Wt 233.8 lb

## 2020-11-28 DIAGNOSIS — I10 Essential (primary) hypertension: Secondary | ICD-10-CM | POA: Diagnosis not present

## 2020-11-28 DIAGNOSIS — E782 Mixed hyperlipidemia: Secondary | ICD-10-CM | POA: Diagnosis not present

## 2020-11-28 DIAGNOSIS — I208 Other forms of angina pectoris: Secondary | ICD-10-CM

## 2020-11-28 DIAGNOSIS — M791 Myalgia, unspecified site: Secondary | ICD-10-CM

## 2020-11-28 DIAGNOSIS — Z789 Other specified health status: Secondary | ICD-10-CM

## 2020-11-28 MED ORDER — METOPROLOL SUCCINATE ER 50 MG PO TB24
50.0000 mg | ORAL_TABLET | Freq: Every day | ORAL | 3 refills | Status: AC
Start: 2020-11-28 — End: 2021-11-23

## 2020-11-28 MED ORDER — LOSARTAN POTASSIUM 25 MG PO TABS: 25.0000 mg | ORAL_TABLET | Freq: Every day | ORAL | 3 refills | Status: DC

## 2020-12-06 DIAGNOSIS — I10 Essential (primary) hypertension: Secondary | ICD-10-CM | POA: Diagnosis not present

## 2020-12-06 DIAGNOSIS — I208 Other forms of angina pectoris: Secondary | ICD-10-CM | POA: Diagnosis not present

## 2020-12-07 DIAGNOSIS — I10 Essential (primary) hypertension: Secondary | ICD-10-CM | POA: Diagnosis not present

## 2020-12-07 DIAGNOSIS — E669 Obesity, unspecified: Secondary | ICD-10-CM | POA: Diagnosis not present

## 2020-12-07 DIAGNOSIS — G4733 Obstructive sleep apnea (adult) (pediatric): Secondary | ICD-10-CM | POA: Diagnosis not present

## 2020-12-07 LAB — BASIC METABOLIC PANEL
BUN/Creatinine Ratio: 15 (ref 10–24)
BUN: 16 mg/dL (ref 8–27)
CO2: 26 mmol/L (ref 20–29)
Calcium: 9.7 mg/dL (ref 8.6–10.2)
Chloride: 100 mmol/L (ref 96–106)
Creatinine, Ser: 1.08 mg/dL (ref 0.76–1.27)
GFR calc Af Amer: 82 mL/min/{1.73_m2} (ref >59)
GFR calc non Af Amer: 71 mL/min/{1.73_m2} (ref >59)
Glucose: 99 mg/dL (ref 65–99)
Potassium: 5.1 mmol/L (ref 3.5–5.2)
Sodium: 140 mmol/L (ref 134–144)

## 2020-12-07 LAB — CBC
Hematocrit: 49.8 % (ref 37.5–51.0)
Hemoglobin: 16.9 g/dL (ref 13.0–17.7)
MCH: 32.7 pg (ref 26.6–33.0)
MCHC: 33.9 g/dL (ref 31.5–35.7)
MCV: 96 fL (ref 79–97)
Platelets: 241 10*3/uL (ref 150–450)
RBC: 5.17 x10E6/uL (ref 4.14–5.80)
RDW: 12.6 % (ref 11.6–15.4)
WBC: 5.2 10*3/uL (ref 3.4–10.8)

## 2020-12-07 LAB — NOVEL CORONAVIRUS, NAA

## 2020-12-07 NOTE — Progress Notes (Signed)
Please inform patient kidney function and electrolytes are stable, continue losartan.

## 2020-12-07 NOTE — Progress Notes (Signed)
Called and spoke with pt regarding lab results. Pt voiced understanding.

## 2020-12-11 DIAGNOSIS — G4733 Obstructive sleep apnea (adult) (pediatric): Secondary | ICD-10-CM | POA: Diagnosis not present

## 2020-12-14 ENCOUNTER — Other Ambulatory Visit (HOSPITAL_COMMUNITY)
Admission: RE | Admit: 2020-12-14 | Discharge: 2020-12-14 | Disposition: A | Discharge status: Home / Self Care | Payer: Medicare Other | Source: Ambulatory Visit | Attending: Cardiology | Admitting: Cardiology

## 2020-12-14 DIAGNOSIS — Z01812 Encounter for preprocedural laboratory examination: Secondary | ICD-10-CM | POA: Diagnosis not present

## 2020-12-14 DIAGNOSIS — Z20822 Contact with and (suspected) exposure to covid-19: Secondary | ICD-10-CM | POA: Insufficient documentation

## 2020-12-14 LAB — SARS CORONAVIRUS 2 (TAT 6-24 HRS): SARS Coronavirus 2: NEGATIVE

## 2020-12-17 DIAGNOSIS — R9439 Abnormal result of other cardiovascular function study: Secondary | ICD-10-CM

## 2020-12-18 ENCOUNTER — Other Ambulatory Visit: Payer: Self-pay

## 2020-12-18 ENCOUNTER — Ambulatory Visit (HOSPITAL_COMMUNITY)
Admission: RE | Admit: 2020-12-18 | Discharge: 2020-12-18 | Disposition: A | Discharge status: Home / Self Care | Payer: Medicare Other | Attending: Cardiology | Admitting: Cardiology

## 2020-12-18 ENCOUNTER — Encounter (HOSPITAL_COMMUNITY)
Admission: RE | Disposition: A | Discharge status: Home / Self Care | Payer: Self-pay | Source: Home / Self Care | Attending: Cardiology

## 2020-12-18 DIAGNOSIS — I25118 Atherosclerotic heart disease of native coronary artery with other forms of angina pectoris: Secondary | ICD-10-CM | POA: Diagnosis not present

## 2020-12-18 DIAGNOSIS — Z8249 Family history of ischemic heart disease and other diseases of the circulatory system: Secondary | ICD-10-CM | POA: Diagnosis not present

## 2020-12-18 DIAGNOSIS — Z79899 Other long term (current) drug therapy: Secondary | ICD-10-CM | POA: Insufficient documentation

## 2020-12-18 DIAGNOSIS — I10 Essential (primary) hypertension: Secondary | ICD-10-CM | POA: Insufficient documentation

## 2020-12-18 DIAGNOSIS — E785 Hyperlipidemia, unspecified: Secondary | ICD-10-CM | POA: Insufficient documentation

## 2020-12-18 DIAGNOSIS — I2584 Coronary atherosclerosis due to calcified coronary lesion: Secondary | ICD-10-CM | POA: Insufficient documentation

## 2020-12-18 DIAGNOSIS — Z7982 Long term (current) use of aspirin: Secondary | ICD-10-CM | POA: Diagnosis not present

## 2020-12-18 DIAGNOSIS — I208 Other forms of angina pectoris: Secondary | ICD-10-CM | POA: Diagnosis present

## 2020-12-18 DIAGNOSIS — R9439 Abnormal result of other cardiovascular function study: Secondary | ICD-10-CM

## 2020-12-18 HISTORY — PX: LEFT HEART CATH AND CORONARY ANGIOGRAPHY: CATH118249

## 2020-12-18 SURGERY — LEFT HEART CATH AND CORONARY ANGIOGRAPHY
Anesthesia: LOCAL

## 2020-12-18 MED ORDER — SODIUM CHLORIDE 0.9 % IV SOLN: 250.0000 mL | INTRAVENOUS | Status: DC | PRN

## 2020-12-18 MED ORDER — MIDAZOLAM HCL 2 MG/2ML IJ SOLN
INTRAMUSCULAR | Status: AC
  Filled 2020-12-18: qty 2

## 2020-12-18 MED ORDER — HYDRALAZINE HCL 20 MG/ML IJ SOLN: 10.0000 mg | INTRAMUSCULAR | Status: DC | PRN

## 2020-12-18 MED ORDER — HEPARIN SODIUM (PORCINE) 1000 UNIT/ML IJ SOLN
INTRAMUSCULAR | Status: AC
  Filled 2020-12-18: qty 1

## 2020-12-18 MED ORDER — VERAPAMIL HCL 2.5 MG/ML IV SOLN
INTRAVENOUS | Status: AC
  Filled 2020-12-18: qty 2

## 2020-12-18 MED ORDER — SODIUM CHLORIDE 0.9 % WEIGHT BASED INFUSION: 1.0000 mL/kg/h | INTRAVENOUS | Status: DC

## 2020-12-18 MED ORDER — ACETAMINOPHEN 325 MG PO TABS: 650.0000 mg | ORAL_TABLET | ORAL | Status: DC | PRN

## 2020-12-18 MED ORDER — ONDANSETRON HCL 4 MG/2ML IJ SOLN: 4.0000 mg | Freq: Four times a day (QID) | INTRAMUSCULAR | Status: DC | PRN

## 2020-12-18 MED ORDER — VERAPAMIL HCL 2.5 MG/ML IV SOLN
INTRAVENOUS | Status: DC | PRN
  Administered 2020-12-18: 10 mL via INTRA_ARTERIAL

## 2020-12-18 MED ORDER — LABETALOL HCL 5 MG/ML IV SOLN: 10.0000 mg | INTRAVENOUS | Status: DC | PRN

## 2020-12-18 MED ORDER — FENTANYL CITRATE (PF) 100 MCG/2ML IJ SOLN
INTRAMUSCULAR | Status: AC
  Filled 2020-12-18: qty 2

## 2020-12-18 MED ORDER — LIDOCAINE HCL (PF) 1 % IJ SOLN
INTRAMUSCULAR | Status: DC | PRN
  Administered 2020-12-18: 3 mL

## 2020-12-18 MED ORDER — HEPARIN (PORCINE) IN NACL 1000-0.9 UT/500ML-% IV SOLN
INTRAVENOUS | Status: AC
  Filled 2020-12-18: qty 1000

## 2020-12-18 MED ORDER — SODIUM CHLORIDE 0.9% FLUSH: 3.0000 mL | INTRAVENOUS | Status: DC | PRN

## 2020-12-18 MED ORDER — IOHEXOL 350 MG/ML SOLN
INTRAVENOUS | Status: DC | PRN
  Administered 2020-12-18: 30 mL

## 2020-12-18 MED ORDER — FENTANYL CITRATE (PF) 100 MCG/2ML IJ SOLN
INTRAMUSCULAR | Status: DC | PRN
  Administered 2020-12-18: 50 ug via INTRAVENOUS

## 2020-12-18 MED ORDER — SODIUM CHLORIDE 0.9 % IV SOLN: INTRAVENOUS | Status: DC

## 2020-12-18 MED ORDER — ASPIRIN 81 MG PO CHEW: 81.0000 mg | CHEWABLE_TABLET | ORAL | Status: DC

## 2020-12-18 MED ORDER — VERAPAMIL HCL 2.5 MG/ML IV SOLN
INTRAVENOUS | Status: DC | PRN
Start: 2020-12-18 — End: 2020-12-18
  Administered 2020-12-18: 2 mg via INTRA_ARTERIAL

## 2020-12-18 MED ORDER — SODIUM CHLORIDE 0.9 % WEIGHT BASED INFUSION
3.0000 mL/kg/h | INTRAVENOUS | Status: AC
  Administered 2020-12-18: 3 mL/kg/h via INTRAVENOUS

## 2020-12-18 MED ORDER — HEPARIN SODIUM (PORCINE) 1000 UNIT/ML IJ SOLN
INTRAMUSCULAR | Status: DC | PRN
  Administered 2020-12-18: 5000 [IU] via INTRAVENOUS

## 2020-12-18 MED ORDER — SODIUM CHLORIDE 0.9% FLUSH: 3.0000 mL | Freq: Two times a day (BID) | INTRAVENOUS | Status: DC

## 2020-12-18 MED ORDER — HEPARIN (PORCINE) IN NACL 1000-0.9 UT/500ML-% IV SOLN
INTRAVENOUS | Status: DC | PRN
  Administered 2020-12-18 (×2): 500 mL

## 2020-12-18 MED ORDER — LIDOCAINE HCL (PF) 1 % IJ SOLN
INTRAMUSCULAR | Status: AC
  Filled 2020-12-18: qty 30

## 2020-12-18 MED ORDER — MIDAZOLAM HCL 2 MG/2ML IJ SOLN
INTRAMUSCULAR | Status: DC | PRN
  Administered 2020-12-18: 1 mg via INTRAVENOUS

## 2020-12-18 SURGICAL SUPPLY — 12 items
CATH INFINITI 5 FR JL3.5 (CATHETERS) ×2 IMPLANT
CATH INFINITI JR4 5F (CATHETERS) ×2 IMPLANT
CATH OPTITORQUE TIG 4.0 5F (CATHETERS) ×2 IMPLANT
DEVICE RAD COMP TR BAND LRG (VASCULAR PRODUCTS) ×2 IMPLANT
GLIDESHEATH SLEND A-KIT 6F 22G (SHEATH) ×2 IMPLANT
GUIDEWIRE INQWIRE 1.5J.035X260 (WIRE) ×1 IMPLANT
INQWIRE 1.5J .035X260CM (WIRE) ×2
KIT HEART LEFT (KITS) ×2 IMPLANT
PACK CARDIAC CATHETERIZATION (CUSTOM PROCEDURE TRAY) ×2 IMPLANT
SYR MEDRAD MARK 7 150ML (SYRINGE) ×2 IMPLANT
TRANSDUCER W/STOPCOCK (MISCELLANEOUS) ×2 IMPLANT
TUBING CIL FLEX 10 FLL-RA (TUBING) ×2 IMPLANT

## 2020-12-18 NOTE — H&P (Signed)
OV 11/28/2020 copied for documentation    Follow up visit  Subjective:   Cory Hunter, male    DOB: [birth year], 70 y.o.   MRN: [id number]   HPI  C/C: Stable angina Abnormal stress test  70 y.o. Caucasian male  hypertension, hyperlipidemia, family h/o CAD with stable angina, abnormal stress test   Paitent presents for 2 month follow up of hypertension. At last visit no changes were made to his medications.  Patient has had no recurrence of chest pain symptoms, although physical activity is limited secondary to hip pain.  Unfortunately patient has not been monitoring his blood pressure on a regular basis at home, however the few readings he can provide are above goal. Patient has chosen to go forward with hip surgery, therefore he would like to proceed with cardiac catheterization prior to the surgery.  Patient denies chest pain, palpitations, dyspnea or dizziness, leg swelling.  He denies orthopnea, PND, syncope, near syncope.  Patient admits to diet high in fat and salt.   No current facility-administered medications on file prior to encounter.   Current Outpatient Medications on File Prior to Encounter  Medication Sig Dispense Refill  . acetaminophen (TYLENOL) 650 MG CR tablet Take 1,300 mg by mouth 2 (two) times daily as needed for pain.    Marland Kitchen amLODipine (NORVASC) 10 MG tablet Take 1 tablet (10 mg total) by mouth daily. 90 tablet 3  . aspirin EC 81 MG tablet Take 1 tablet (81 mg total) by mouth daily. 90 tablet 3  . FLUoxetine (PROZAC) 40 MG capsule Take 40 mg by mouth daily.    Marland Kitchen losartan (COZAAR) 25 MG tablet Take 1 tablet (25 mg total) by mouth daily. 30 tablet 3  . Melatonin 10 MG TABS Take 10 mg by mouth at bedtime.    . metoprolol succinate (TOPROL-XL) 50 MG 24 hr tablet Take 1 tablet (50 mg total) by mouth daily. Take with or immediately following a meal. 90 tablet 3  . pantoprazole (PROTONIX) 40 MG tablet Take 1 tablet by mouth once daily (Patient taking differently: Take 40  mg by mouth daily.) 90 tablet 3  . REPATHA 140 MG/ML SOSY Inject 140 mg into the muscle every 14 (fourteen) days.    Marland Kitchen zolpidem (AMBIEN) 5 MG tablet Take 5 mg by mouth at bedtime.    . diclofenac (VOLTAREN) 75 MG EC tablet Take 75 mg by mouth daily as needed for moderate pain.    . nitroGLYCERIN (NITROSTAT) 0.4 MG SL tablet Place 1 tablet (0.4 mg total) under the tongue every 5 (five) minutes as needed for chest pain. 30 tablet 3    Cardiovascular & other pertient studies:  EKG 09/27/2020: Sinus rhythm 63 bpm Nonspecific T wave changes  Echocardiogram 01/16/2020:  Left ventricle cavity is normal in size. Mild concentric hypertrophy of the left ventricle.  Normal global wall motion. Normal LV systolic function  with EF 67%. Doppler evidence of grade I (impaired) diastolic dysfunction, normal LAP.  Left atrial cavity is mildly dilated.  Mild (Grade I) mitral regurgitation.  Inadequate TR jet to estimate pulmonary artery systolic pressure. Normal right atrial pressure.   Lexiscan Tetrofosmin stress test 01/16/2020: No previous exam available for comparison. Lexiscan nuclear stress test performed using 1-day protocol.  Stress EKG is non-diagnostic, as this is pharmacological stress test.  In addition, stress EKG showed <1 mm horizontal ST depressions in inferolateral leads.  Medium sized, medium severe intensity, completely reversible perfusion defect in apical to mid anterolateral myocardium.  In  addition, there is a small sized, mild intensity, reversible perfusion defect in basal inferoseptal myocardium.  Stress LVEF 52% TID index is 1.20. High risk study.    EKG 01/09/2020: Sinus rhythm 70 bpm.  Probable old inferior infarct.    Recent labs: 10/31/2020: Hemoglobin 16.5, hematocrit 40.1, MCV 97.8, platelet 230 Total cholesterol 163, triglycerides 229, HDL 29, LDL 95, non-HDL 134 Glucose 96, BUN 17, creatinine 1.12, GFR 65, sodium 139, potassium 4.8  4/12/20221: Chol 126, TG  156, HDL 27, LDL 72  10/04/2019: Glucose 99, BUN/Cr 16/0.91. eGFR 83. Na/K 140/4.2. Rest of the CMP normal.  H/H 16.1/46.8 HbA1C 5.7% Chol 262, TG 239, HDL 29, LDL 186   06/03/2017: TSH 1.24 normal     Review of Systems  Constitutional: Negative for malaise/fatigue and weight gain.  Cardiovascular: Negative for chest pain, claudication, dyspnea on exertion, leg swelling, near-syncope, orthopnea, palpitations, paroxysmal nocturnal dyspnea and syncope.  Respiratory: Negative for shortness of breath.   Hematologic/Lymphatic: Does not bruise/bleed easily.  Musculoskeletal: Positive for joint pain (Hip).  Gastrointestinal: Negative for melena.  Neurological: Negative for dizziness and weakness.          Vitals:   12/18/20 0925  BP: (!) 143/93  Pulse: (!) 59  Temp: 97.9 F (36.6 C)  SpO2: 99%     Body mass index is 31.19 kg/m. Filed Weights   12/18/20 0925  Weight: 104.3 kg     Objective:   Physical Exam Vitals and nursing note reviewed.  Constitutional:      General: He is not in acute distress. HENT:     Head: Normocephalic and atraumatic.  Neck:     Vascular: No JVD.  Cardiovascular:     Rate and Rhythm: Normal rate and regular rhythm.     Pulses: Intact distal pulses.     Heart sounds: Normal heart sounds, S1 normal and S2 normal. No murmur heard. No gallop.   Pulmonary:     Effort: Pulmonary effort is normal. No respiratory distress.     Breath sounds: Normal breath sounds. No wheezing, rhonchi or rales.  Musculoskeletal:     Right lower leg: No edema.     Left lower leg: No edema.  Skin:    General: Skin is warm and dry.  Neurological:     Mental Status: He is alert.         Assessment & Recommendations:    70 y.o. Caucasian male  hypertension, hyperlipidemia, family h/o CAD with stable angina, abnormal stress test   CAD with stable angina:  Stress test in 12/2019 showed high risk findings. Patient has no anginal symptoms, although his  physical activity is significantly limited secondary to pain in his hip.  Patient had previously opted to continue with medical management of coronary artery disease, however he has decided to proceed with hip surgery and therefore recommend he undergo cardiac catheterization for cardiac risk stratification.  Patient is aware that if he undergoes revascularization he will need uninterrupted dual antiplatelet therapy for 3 to 6 months, which will further delay his hip surgery. The heart catheterization procedure was explained to the patient in detail. The indication, alternatives, risks and benefits were reviewed. Complications including but not limited to bleeding, infection, acute kidney injury, blood transfusion, heart rhythm disturbances, contrast (dye) reaction, damage to the arteries or nerves in the legs or hands, cerebrovascular accident, myocardial infarction, need for emergent bypass surgery, blood clots in the legs, possible need for emergent blood transfusion, and rarely death were reviewed and  discussed with the patient. The patient voices understanding and wishes to proceed. Continue vascular medications including aspirin, metoprolol succinate, amlodipine, Repatha, and as needed sublingual nitroglycerin.  Hypertension: Blood pressure remains uncontrolled as it is elevated in the office as well as on home monitor readings.  We will continue current antihypertensive medications. We will restart losartan 25 mg daily which she has been on in the past.  Repeat BMP in 7 to 10 days. Patient will monitor his blood pressure at home on a daily basis and bring with him a log to his next office visit.  Hyperlipidemia:  Patient has history of statin intolerance due to significant myalgias.  He is presently tolerating Repatha well. Continue Repatha. I personally reviewed external labs triglycerides are elevated.  Discussed at length regarding diet and lifestyle modifications.  Patient prefers to attempt  diet and lifestyle modifications prior to medication adjustments. Will recheck lipid profile in 3 months.   This was a 35-minute encounter with face-to-face counseling, medical records review, coordination of care, explanation of complex medical issues, complex medical decision making.     [place] Esther Hardy, PA-C 12/18/2020, 12:41 PM Office: [phone]

## 2020-12-18 NOTE — Discharge Instructions (Signed)
Radial Site Care  This sheet gives you information about how to care for yourself after your procedure. Your health care provider may also give you more specific instructions. If you have problems or questions, contact your health care provider. What can I expect after the procedure? After the procedure, it is common to have:  Bruising and tenderness at the catheter insertion area. Follow these instructions at home: Medicines  Take over-the-counter and prescription medicines only as told by your health care provider. Insertion site care  Follow instructions from your health care provider about how to take care of your insertion site. Make sure you: ? Wash your hands with soap and water before you change your bandage (dressing). If soap and water are not available, use hand sanitizer. ? Change your dressing as told by your health care provider. ? Leave stitches (sutures), skin glue, or adhesive strips in place. These skin closures may need to stay in place for 2 weeks or longer. If adhesive strip edges start to loosen and curl up, you may trim the loose edges. Do not remove adhesive strips completely unless your health care provider tells you to do that.  Check your insertion site every day for signs of infection. Check for: ? Redness, swelling, or pain. ? Fluid or blood. ? Pus or a bad smell. ? Warmth.  Do not take baths, swim, or use a hot tub until your health care provider approves.  You may shower 24-48 hours after the procedure, or as directed by your health care provider. ? Remove the dressing and gently wash the site with plain soap and water. ? Pat the area dry with a clean towel. ? Do not rub the site. That could cause bleeding.  Do not apply powder or lotion to the site. Activity  For 24 hours after the procedure, or as directed by your health care provider: ? Do not flex or bend the affected arm. ? Do not push or pull heavy objects with the affected arm. ? Do not drive  yourself home from the hospital or clinic. You may drive 24 hours after the procedure unless your health care provider tells you not to. ? Do not operate machinery or power tools.  Do not lift anything that is heavier than 10 lb (4.5 kg), or the limit that you are told, until your health care provider says that it is safe.  Ask your health care provider when it is okay to: ? Return to work or school. ? Resume usual physical activities or sports. ? Resume sexual activity.   General instructions  If the catheter site starts to bleed, raise your arm and put firm pressure on the site. If the bleeding does not stop, get help right away. This is a medical emergency.  If you went home on the same day as your procedure, a responsible adult should be with you for the first 24 hours after you arrive home.  Keep all follow-up visits as told by your health care provider. This is important. Contact a health care provider if:  You have a fever.  You have redness, swelling, or yellow drainage around your insertion site. Get help right away if:  You have unusual pain at the radial site.  The catheter insertion area swells very fast.  The insertion area is bleeding, and the bleeding does not stop when you hold steady pressure on the area.  Your arm or hand becomes pale, cool, tingly, or numb. These symptoms may represent a serious   problem that is an emergency. Do not wait to see if the symptoms will go away. Get medical help right away. Call your local emergency services (911 in the [place] Do not drive yourself to the hospital. Summary  After the procedure, it is common to have bruising and tenderness at the site.  Follow instructions from your health care provider about how to take care of your radial site wound. Check the wound every day for signs of infection.  Do not lift anything that is heavier than 10 lb (4.5 kg), or the limit that you are told, until your health care provider says that it  is safe. This information is not intended to replace advice given to you by your health care provider. Make sure you discuss any questions you have with your health care provider. Document Revised: 11/11/2017 Document Reviewed: 11/11/2017 Elsevier Patient Education  2021 [employer]  

## 2020-12-18 NOTE — Interval H&P Note (Signed)
History and Physical Interval Note:  12/18/2020 1:12 PM  Cory Hunter  has presented today for surgery, with the diagnosis of hp - cad.  The various methods of treatment have been discussed with the patient and family. After consideration of risks, benefits and other options for treatment, the patient has consented to  Procedure(s): LEFT HEART CATH AND CORONARY ANGIOGRAPHY (N/A) as a surgical intervention.  The patient's history has been reviewed, patient examined, no change in status, stable for surgery.  I have reviewed the patient's chart and labs.  Questions were answered to the patient's satisfaction.    2012 Appropriate Use Criteria for Diagnostic Catheterization Home / Select Test of Interest Desired Test and Scenario Suspected CAD Prior Noninvasive Testing Pharmacological Stress Test CAD Assessment (Coronary Angiography With or Without Left Heart Catheterization and/or Left Ventricu CAD Assessment (Coronary Angiography With or Without Left Heart Catheterization and/or Left Ventriculography) Link Here: [email or web address] Indication:  Suspected CAD (No Prior PCI, No Prior CABG, and No Prior Angiogram Showing > = 50% Angiographic Stenosis) Prior Noninvasive Testing: Stress Test With Imaging (SPECT MPI, Stress Echocardiography, Stress PET, Stress CMR) High-risk findings (e.g., 10% ischemic myocardium on stress SPECT MPI or stress PET, stress-induced wall motion abnormality in 2 or more segments on stress echo or stress CMR) Pretest Symptom Status: Asymptom   Manish J Patwardhan

## 2020-12-19 ENCOUNTER — Encounter (HOSPITAL_COMMUNITY): Payer: Self-pay | Admitting: Cardiology

## 2020-12-24 ENCOUNTER — Other Ambulatory Visit: Payer: Self-pay

## 2020-12-24 ENCOUNTER — Encounter: Payer: Self-pay | Admitting: Cardiology

## 2020-12-24 ENCOUNTER — Ambulatory Visit: Payer: Medicare Other | Admitting: Cardiology

## 2020-12-24 VITALS — BP 130/89 | HR 65 | Temp 97.4°F | Resp 16 | Ht 72.0 in | Wt 233.0 lb

## 2020-12-24 DIAGNOSIS — E782 Mixed hyperlipidemia: Secondary | ICD-10-CM

## 2020-12-24 DIAGNOSIS — I10 Essential (primary) hypertension: Secondary | ICD-10-CM

## 2020-12-24 DIAGNOSIS — Z01810 Encounter for preprocedural cardiovascular examination: Secondary | ICD-10-CM | POA: Diagnosis not present

## 2020-12-24 DIAGNOSIS — I2510 Atherosclerotic heart disease of native coronary artery without angina pectoris: Secondary | ICD-10-CM | POA: Diagnosis not present

## 2020-12-24 NOTE — Progress Notes (Signed)
Called and spoke with pt regarding lab results. Pt voiced understanding.   Follow up visit  Subjective:   Cory Hunter, male    DOB: 12-18-1950, 70 y.o.   MRN: [id number]   HPI   Chief Complaint  Patient presents with  . Coronary Artery Disease  . Hypertension  . Hyperlipidemia  . Follow-up    6 weeks    70 y.o. Caucasian male  hypertension, hyperlipidemia, coronary artery disease   Patient underwent coronary angiogram in 11/2020 that showed severe multivessel coronary artery disease.  Patient denies chest pain at this time.  He continues to have hip pain.  He is hoping to undergo hip surgery in the near future.     Current Outpatient Medications on File Prior to Visit  Medication Sig Dispense Refill  . acetaminophen (TYLENOL) 650 MG CR tablet Take 1,300 mg by mouth 2 (two) times daily as needed for pain.    Marland Kitchen amLODipine (NORVASC) 10 MG tablet Take 1 tablet (10 mg total) by mouth daily. 90 tablet 3  . aspirin EC 81 MG tablet Take 1 tablet (81 mg total) by mouth daily. 90 tablet 3  . diclofenac (VOLTAREN) 75 MG EC tablet Take 75 mg by mouth daily as needed for moderate pain.    Marland Kitchen FLUoxetine (PROZAC) 40 MG capsule Take 40 mg by mouth daily.    Marland Kitchen losartan (COZAAR) 25 MG tablet Take 1 tablet (25 mg total) by mouth daily. 30 tablet 3  . Melatonin 10 MG TABS Take 10 mg by mouth at bedtime.    . metoprolol succinate (TOPROL-XL) 50 MG 24 hr tablet Take 1 tablet (50 mg total) by mouth daily. Take with or immediately following a meal. 90 tablet 3  . nitroGLYCERIN (NITROSTAT) 0.4 MG SL tablet Place 1 tablet (0.4 mg total) under the tongue every 5 (five) minutes as needed for chest pain. 30 tablet 3  . pantoprazole (PROTONIX) 40 MG tablet Take 1 tablet by mouth once daily (Patient taking differently: Take 40 mg by mouth daily.) 90 tablet 3  . REPATHA 140 MG/ML SOSY Inject 140 mg into the muscle every 14 (fourteen) days.    Marland Kitchen zolpidem (AMBIEN) 5 MG tablet Take 5 mg by mouth at bedtime.      No current facility-administered medications on file prior to visit.    Cardiovascular & other pertient studies:  Coronary angiography 12/18/2020: LM: Normal LAD: Ostial 30% stenosis, prox-mid 30% disease, followed by 100% mid occlusion after D2        D2 ostial 30% stenosis LCx: OM2 prox tandem 80% stenoses RCA: Prox mid 10-20% disease         Ostial PDA 30% stenosis         Grade 2 right-to-left collaterals from PDA to LAD, fills up to D3  LVEDP, LVEF normal  Severe two vessel CAD, relatively stable on medical management Will discuss medical therapy vs revascularization options and timing of upcoming hip surgery  EKG 09/27/2020: Sinus rhythm 63 bpm Nonspecific T wave changes  Echocardiogram 01/16/2020:  Left ventricle cavity is normal in size. Mild concentric hypertrophy of the left ventricle.  Normal global wall motion. Normal LV systolic function  with EF 67%. Doppler evidence of grade I (impaired) diastolic dysfunction, normal LAP.  Left atrial cavity is mildly dilated.  Mild (Grade I) mitral regurgitation.  Inadequate TR jet to estimate pulmonary artery systolic pressure. Normal right atrial pressure.  Lexiscan Tetrofosmin stress test 01/16/2020: No previous exam available for comparison. Lexiscan nuclear stress  test performed using 1-day protocol.  Stress EKG is non-diagnostic, as this is pharmacological stress test.  In addition, stress EKG showed <1 mm horizontal ST depressions in inferolateral leads.  Medium sized, medium severe intensity, completely reversible perfusion defect in apical to mid anterolateral myocardium.  In addition, there is a small sized, mild intensity, reversible perfusion defect in basal inferoseptal myocardium.  Stress LVEF 52% TID index is 1.20. High risk study.    Recent labs: 4/12/20221: Chol 126, TG 156, HDL 27, LDL 72  10/04/2019: Glucose 99, BUN/Cr 16/0.91. eGFR 83. Na/K 140/4.2. Rest of the CMP normal.  H/H 16.1/46.8 HbA1C  5.7% Chol 262, TG 239, HDL 29, LDL 186   06/03/2017: TSH 1.24 normal     Review of Systems  Cardiovascular: Negative for chest pain, dyspnea on exertion, leg swelling, palpitations and syncope.  Respiratory: Negative for shortness of breath.   Musculoskeletal: Positive for joint pain (Hip).         Vitals:   12/24/20 1250  BP: 130/89  Pulse: 65  Resp: 16  Temp: (!) 97.4 F (36.3 C)  SpO2: 95%     Body mass index is 31.6 kg/m. Filed Weights   12/24/20 1250  Weight: 233 lb (105.7 kg)     Objective:   Physical Exam Vitals and nursing note reviewed.  Constitutional:      General: He is not in acute distress. Neck:     Vascular: No JVD.  Cardiovascular:     Rate and Rhythm: Normal rate and regular rhythm.     Pulses: Intact distal pulses.     Heart sounds: Normal heart sounds. No murmur heard.   Pulmonary:     Effort: Pulmonary effort is normal.     Breath sounds: Normal breath sounds. No wheezing or rales.           Assessment & Recommendations:   70 y.o. Caucasian male  hypertension, hyperlipidemia, coronary artery disease   Preoperative stratification: Severe multivessel CAD with LAD CTO, severe left circumflex stenoses, right to left collaterals from RCA. At this time, patient's physical activity is limited due to his hip pain.  With his limited physical capacity, he does not have any anginal symptoms.  He is significant lifestyle limiting issue remains his hip pain. While his perioperative cardiac risk is elevated, it is not prohibitive.  In absence of anginal symptoms, I do not think coronary revascularization would reduce his perioperative risk. I suggest that he proceed with hip surgery with elevated but acceptable perioperative cardiac risk. After hip surgery and increased ambulation, I suspect he will have more angina symptoms.  We will consider revascularization at that time. At this time, continue baseline excellent medical therapy,  including aspirin, Repatha, metoprolol, amlodipine.  Hypertension: Controlled  Hyperlipidemia:  Statin intolerance. Continue Repatha.  LDL down from 186 to 72  F/u in 3 months  [place], MD [facility] Pager: [phone] Office: [phone]

## 2021-01-09 ENCOUNTER — Ambulatory Visit: Payer: Medicare Other | Admitting: Cardiology

## 2021-01-09 ENCOUNTER — Ambulatory Visit: Payer: Medicare Other | Admitting: Student

## 2021-02-26 DIAGNOSIS — M1611 Unilateral primary osteoarthritis, right hip: Secondary | ICD-10-CM | POA: Diagnosis not present

## 2021-02-26 DIAGNOSIS — R7309 Other abnormal glucose: Secondary | ICD-10-CM | POA: Diagnosis not present

## 2021-02-26 DIAGNOSIS — M1610 Unilateral primary osteoarthritis, unspecified hip: Secondary | ICD-10-CM | POA: Diagnosis not present

## 2021-03-06 DIAGNOSIS — Z01818 Encounter for other preprocedural examination: Secondary | ICD-10-CM | POA: Diagnosis not present

## 2021-03-06 DIAGNOSIS — Z01810 Encounter for preprocedural cardiovascular examination: Secondary | ICD-10-CM | POA: Diagnosis not present

## 2021-03-06 DIAGNOSIS — M1611 Unilateral primary osteoarthritis, right hip: Secondary | ICD-10-CM | POA: Diagnosis not present

## 2021-03-15 DIAGNOSIS — M1611 Unilateral primary osteoarthritis, right hip: Secondary | ICD-10-CM | POA: Diagnosis not present

## 2021-03-15 DIAGNOSIS — Z471 Aftercare following joint replacement surgery: Secondary | ICD-10-CM | POA: Diagnosis not present

## 2021-03-15 DIAGNOSIS — Z96641 Presence of right artificial hip joint: Secondary | ICD-10-CM | POA: Diagnosis not present

## 2021-03-15 DIAGNOSIS — I10 Essential (primary) hypertension: Secondary | ICD-10-CM | POA: Diagnosis not present

## 2021-03-16 DIAGNOSIS — I10 Essential (primary) hypertension: Secondary | ICD-10-CM | POA: Diagnosis not present

## 2021-03-16 DIAGNOSIS — M1611 Unilateral primary osteoarthritis, right hip: Secondary | ICD-10-CM | POA: Diagnosis not present

## 2021-03-18 DIAGNOSIS — I10 Essential (primary) hypertension: Secondary | ICD-10-CM | POA: Diagnosis not present

## 2021-03-18 DIAGNOSIS — R454 Irritability and anger: Secondary | ICD-10-CM | POA: Diagnosis not present

## 2021-03-18 DIAGNOSIS — I2510 Atherosclerotic heart disease of native coronary artery without angina pectoris: Secondary | ICD-10-CM | POA: Diagnosis not present

## 2021-03-18 DIAGNOSIS — Z96641 Presence of right artificial hip joint: Secondary | ICD-10-CM | POA: Diagnosis not present

## 2021-03-18 DIAGNOSIS — Z87891 Personal history of nicotine dependence: Secondary | ICD-10-CM | POA: Diagnosis not present

## 2021-03-18 DIAGNOSIS — F419 Anxiety disorder, unspecified: Secondary | ICD-10-CM | POA: Diagnosis not present

## 2021-03-18 DIAGNOSIS — Z471 Aftercare following joint replacement surgery: Secondary | ICD-10-CM | POA: Diagnosis not present

## 2021-03-28 DIAGNOSIS — Z96641 Presence of right artificial hip joint: Secondary | ICD-10-CM | POA: Diagnosis not present

## 2021-03-28 DIAGNOSIS — Z471 Aftercare following joint replacement surgery: Secondary | ICD-10-CM | POA: Diagnosis not present

## 2021-03-29 ENCOUNTER — Ambulatory Visit: Payer: Medicare Other | Admitting: Cardiology

## 2021-04-02 DIAGNOSIS — Z789 Other specified health status: Secondary | ICD-10-CM | POA: Diagnosis not present

## 2021-04-02 DIAGNOSIS — Z471 Aftercare following joint replacement surgery: Secondary | ICD-10-CM | POA: Diagnosis not present

## 2021-04-02 DIAGNOSIS — M6281 Muscle weakness (generalized): Secondary | ICD-10-CM | POA: Diagnosis not present

## 2021-04-02 DIAGNOSIS — Z96641 Presence of right artificial hip joint: Secondary | ICD-10-CM | POA: Diagnosis not present

## 2021-04-02 DIAGNOSIS — M1611 Unilateral primary osteoarthritis, right hip: Secondary | ICD-10-CM | POA: Diagnosis not present

## 2021-04-02 DIAGNOSIS — Z7409 Other reduced mobility: Secondary | ICD-10-CM | POA: Diagnosis not present

## 2021-04-03 ENCOUNTER — Ambulatory Visit: Payer: Medicare Other | Admitting: Cardiology

## 2021-04-03 ENCOUNTER — Encounter: Payer: Self-pay | Admitting: Cardiology

## 2021-04-03 VITALS — BP 129/79 | HR 72 | Temp 98.5°F | Resp 16 | Ht 72.0 in | Wt 225.0 lb

## 2021-04-03 DIAGNOSIS — E782 Mixed hyperlipidemia: Secondary | ICD-10-CM | POA: Diagnosis not present

## 2021-04-03 DIAGNOSIS — I2510 Atherosclerotic heart disease of native coronary artery without angina pectoris: Secondary | ICD-10-CM | POA: Insufficient documentation

## 2021-04-03 DIAGNOSIS — I10 Essential (primary) hypertension: Secondary | ICD-10-CM | POA: Diagnosis not present

## 2021-04-03 NOTE — Progress Notes (Signed)
Follow up visit  Subjective:   Cory Hunter, male    DOB: June 21, 1951, 70 y.o.   MRN: [id number]   HPI   Chief Complaint  Patient presents with   Coronary Artery Disease   Hypertension   Follow-up    3 month    70 y.o. Caucasian male  hypertension, hyperlipidemia, coronary artery disease   Patient underwent successful neck surgery 2 weeks ago.  He is still recovering, doing physical therapy.  Overall, he has lack of energy, stable shortness of breath.  However, he denies any chest pain.  Current Outpatient Medications on File Prior to Visit  Medication Sig Dispense Refill   acetaminophen (TYLENOL) 650 MG CR tablet Take 1,300 mg by mouth 2 (two) times daily as needed for pain.     amLODipine (NORVASC) 10 MG tablet Take 1 tablet (10 mg total) by mouth daily. 90 tablet 3   aspirin EC 81 MG tablet Take 1 tablet (81 mg total) by mouth daily. 90 tablet 3   diclofenac (VOLTAREN) 75 MG EC tablet Take 75 mg by mouth daily as needed for moderate pain.     FLUoxetine (PROZAC) 40 MG capsule Take 40 mg by mouth daily.     losartan (COZAAR) 25 MG tablet Take 1 tablet (25 mg total) by mouth daily. 30 tablet 3   Melatonin 10 MG TABS Take 10 mg by mouth at bedtime.     metoprolol succinate (TOPROL-XL) 50 MG 24 hr tablet Take 1 tablet (50 mg total) by mouth daily. Take with or immediately following a meal. 90 tablet 3   nitroGLYCERIN (NITROSTAT) 0.4 MG SL tablet Place 1 tablet (0.4 mg total) under the tongue every 5 (five) minutes as needed for chest pain. 30 tablet 3   pantoprazole (PROTONIX) 40 MG tablet Take 1 tablet by mouth once daily (Patient taking differently: Take 40 mg by mouth daily.) 90 tablet 3   REPATHA 140 MG/ML SOSY Inject 140 mg into the muscle every 14 (fourteen) days.     zolpidem (AMBIEN) 5 MG tablet Take 5 mg by mouth at bedtime.     No current facility-administered medications on file prior to visit.    Cardiovascular & other pertient studies:  EKG 12/18/2020: Sinus  rhythm 61 bpm Normal ECG  Coronary angiography 12/18/2020: LM: Normal LAD: Ostial 30% stenosis, prox-mid 30% disease, followed by 100% mid occlusion after D2        D2 ostial 30% stenosis LCx: OM2 prox tandem 80% stenoses RCA: Prox mid 10-20% disease         Ostial PDA 30% stenosis         Grade 2 right-to-left collaterals from PDA to LAD, fills up to D3   LVEDP, LVEF normal   Severe two vessel CAD, relatively stable on medical management Will discuss medical therapy vs revascularization options and timing of upcoming hip surgery   Echocardiogram 01/16/2020:  Left ventricle cavity is normal in size. Mild concentric hypertrophy of the left ventricle.  Normal global wall motion. Normal LV systolic function  with EF 67%. Doppler evidence of grade I (impaired) diastolic dysfunction, normal LAP.  Left atrial cavity is mildly dilated.  Mild (Grade I) mitral regurgitation.  Inadequate TR jet to estimate pulmonary artery systolic pressure. Normal right atrial pressure.  Lexiscan Tetrofosmin stress test 01/16/2020: No previous exam available for comparison. Lexiscan nuclear stress test performed using 1-day protocol.  Stress EKG is non-diagnostic, as this is pharmacological stress test.  In addition, stress EKG showed <  1 mm horizontal ST depressions in inferolateral leads.  Medium sized, medium severe intensity, completely reversible perfusion defect in apical to mid anterolateral myocardium.  In addition, there is a small sized, mild intensity, reversible perfusion defect in basal inferoseptal myocardium.  Stress LVEF 52% TID index is 1.20. High risk study.    Recent labs: 4/12/20221: Chol 126, TG 156, HDL 27, LDL 72  10/04/2019: Glucose 99, BUN/Cr 16/0.91. eGFR 83. Na/K 140/4.2. Rest of the CMP normal.  H/H 16.1/46.8 HbA1C 5.7% Chol 262, TG 239, HDL 29, LDL 186   06/03/2017: TSH 1.24 normal     Review of Systems  Constitutional: Positive for malaise/fatigue.   Cardiovascular:  Positive for dyspnea on exertion (Mild, chronic, stable). Negative for chest pain, leg swelling, palpitations and syncope.  Musculoskeletal:  Positive for back pain.        Vitals:   04/03/21 1022  BP: 129/79  Pulse: 72  Resp: 16  Temp: 98.5 F (36.9 C)  SpO2: 95%     Body mass index is 30.52 kg/m. Filed Weights   04/03/21 1022  Weight: 225 lb (102.1 kg)     Objective:   Physical Exam Vitals and nursing note reviewed.  Constitutional:      General: He is not in acute distress. Neck:     Vascular: No JVD.  Cardiovascular:     Rate and Rhythm: Normal rate and regular rhythm.     Heart sounds: Normal heart sounds. No murmur heard. Pulmonary:     Effort: Pulmonary effort is normal.     Breath sounds: Normal breath sounds. No wheezing or rales.          Assessment & Recommendations:   70 y.o. Caucasian male  hypertension, hyperlipidemia, coronary artery disease   CAD: Normal LAD CTO, moderate stenosis in OM1, right to left collaterals from RCA to LAD. (12/2020) No angina symptoms at this time. Continue aggressive medical management. Continue aspirin 81 mg daily after he finishes twice daily recommendation for DVT prophylaxis postoperatively. Continue Repatha.  LDL down from 186 to 72. Continue metoprolol, amlodipine, losartan.  Hypertension: Controlled  Hyperlipidemia:  Statin intolerance. Continue Repatha.  LDL down from 186 to 72  Lipid panel and follow-up in 6 months  [place], MD [facility] [place] Pager: [phone] Office: [phone]

## 2021-04-05 ENCOUNTER — Other Ambulatory Visit: Payer: Self-pay | Admitting: Cardiology

## 2021-04-09 ENCOUNTER — Other Ambulatory Visit: Payer: Self-pay | Admitting: Student

## 2021-04-09 DIAGNOSIS — Z789 Other specified health status: Secondary | ICD-10-CM | POA: Diagnosis not present

## 2021-04-09 DIAGNOSIS — M1611 Unilateral primary osteoarthritis, right hip: Secondary | ICD-10-CM | POA: Diagnosis not present

## 2021-04-09 DIAGNOSIS — Z7409 Other reduced mobility: Secondary | ICD-10-CM | POA: Diagnosis not present

## 2021-04-09 DIAGNOSIS — M6281 Muscle weakness (generalized): Secondary | ICD-10-CM | POA: Diagnosis not present

## 2021-04-09 DIAGNOSIS — Z471 Aftercare following joint replacement surgery: Secondary | ICD-10-CM | POA: Diagnosis not present

## 2021-04-09 DIAGNOSIS — I10 Essential (primary) hypertension: Secondary | ICD-10-CM

## 2021-04-09 DIAGNOSIS — Z96641 Presence of right artificial hip joint: Secondary | ICD-10-CM | POA: Diagnosis not present

## 2021-04-29 DIAGNOSIS — M791 Myalgia, unspecified site: Secondary | ICD-10-CM | POA: Insufficient documentation

## 2021-05-01 DIAGNOSIS — Z471 Aftercare following joint replacement surgery: Secondary | ICD-10-CM | POA: Diagnosis not present

## 2021-05-01 DIAGNOSIS — Z789 Other specified health status: Secondary | ICD-10-CM | POA: Diagnosis not present

## 2021-05-01 DIAGNOSIS — Z96641 Presence of right artificial hip joint: Secondary | ICD-10-CM | POA: Diagnosis not present

## 2021-05-01 DIAGNOSIS — Z7409 Other reduced mobility: Secondary | ICD-10-CM | POA: Diagnosis not present

## 2021-05-01 DIAGNOSIS — M6281 Muscle weakness (generalized): Secondary | ICD-10-CM | POA: Diagnosis not present

## 2021-05-02 DIAGNOSIS — G720 Drug-induced myopathy: Secondary | ICD-10-CM | POA: Diagnosis not present

## 2021-05-02 DIAGNOSIS — F419 Anxiety disorder, unspecified: Secondary | ICD-10-CM | POA: Diagnosis not present

## 2021-05-02 DIAGNOSIS — I10 Essential (primary) hypertension: Secondary | ICD-10-CM | POA: Diagnosis not present

## 2021-05-02 DIAGNOSIS — G4700 Insomnia, unspecified: Secondary | ICD-10-CM | POA: Diagnosis not present

## 2021-05-02 DIAGNOSIS — E785 Hyperlipidemia, unspecified: Secondary | ICD-10-CM | POA: Diagnosis not present

## 2021-05-07 ENCOUNTER — Other Ambulatory Visit: Payer: Self-pay | Admitting: Student

## 2021-05-07 DIAGNOSIS — I10 Essential (primary) hypertension: Secondary | ICD-10-CM

## 2021-05-09 DIAGNOSIS — Z789 Other specified health status: Secondary | ICD-10-CM | POA: Diagnosis not present

## 2021-05-09 DIAGNOSIS — Z7409 Other reduced mobility: Secondary | ICD-10-CM | POA: Diagnosis not present

## 2021-05-09 DIAGNOSIS — Z471 Aftercare following joint replacement surgery: Secondary | ICD-10-CM | POA: Diagnosis not present

## 2021-05-09 DIAGNOSIS — Z96641 Presence of right artificial hip joint: Secondary | ICD-10-CM | POA: Diagnosis not present

## 2021-05-09 DIAGNOSIS — M6281 Muscle weakness (generalized): Secondary | ICD-10-CM | POA: Diagnosis not present

## 2021-06-14 ENCOUNTER — Other Ambulatory Visit: Payer: Self-pay | Admitting: Cardiology

## 2021-06-14 DIAGNOSIS — I10 Essential (primary) hypertension: Secondary | ICD-10-CM

## 2021-06-17 DIAGNOSIS — G4700 Insomnia, unspecified: Secondary | ICD-10-CM | POA: Diagnosis not present

## 2021-06-17 DIAGNOSIS — E785 Hyperlipidemia, unspecified: Secondary | ICD-10-CM | POA: Diagnosis not present

## 2021-06-17 DIAGNOSIS — I10 Essential (primary) hypertension: Secondary | ICD-10-CM | POA: Diagnosis not present

## 2021-06-17 DIAGNOSIS — M1611 Unilateral primary osteoarthritis, right hip: Secondary | ICD-10-CM | POA: Diagnosis not present

## 2021-06-18 ENCOUNTER — Other Ambulatory Visit: Payer: Self-pay | Admitting: Cardiology

## 2021-06-18 DIAGNOSIS — I10 Essential (primary) hypertension: Secondary | ICD-10-CM

## 2021-07-02 ENCOUNTER — Other Ambulatory Visit: Payer: Self-pay | Admitting: Cardiology

## 2021-07-02 DIAGNOSIS — I10 Essential (primary) hypertension: Secondary | ICD-10-CM

## 2021-07-11 DIAGNOSIS — G4700 Insomnia, unspecified: Secondary | ICD-10-CM | POA: Diagnosis not present

## 2021-07-11 DIAGNOSIS — E785 Hyperlipidemia, unspecified: Secondary | ICD-10-CM | POA: Diagnosis not present

## 2021-07-11 DIAGNOSIS — M1611 Unilateral primary osteoarthritis, right hip: Secondary | ICD-10-CM | POA: Diagnosis not present

## 2021-07-11 DIAGNOSIS — I10 Essential (primary) hypertension: Secondary | ICD-10-CM | POA: Diagnosis not present

## 2021-07-17 DIAGNOSIS — G4700 Insomnia, unspecified: Secondary | ICD-10-CM | POA: Diagnosis not present

## 2021-07-17 DIAGNOSIS — E785 Hyperlipidemia, unspecified: Secondary | ICD-10-CM | POA: Diagnosis not present

## 2021-07-17 DIAGNOSIS — B1920 Unspecified viral hepatitis C without hepatic coma: Secondary | ICD-10-CM | POA: Diagnosis not present

## 2021-07-17 DIAGNOSIS — Z125 Encounter for screening for malignant neoplasm of prostate: Secondary | ICD-10-CM | POA: Diagnosis not present

## 2021-07-17 DIAGNOSIS — Z23 Encounter for immunization: Secondary | ICD-10-CM | POA: Diagnosis not present

## 2021-07-17 DIAGNOSIS — I10 Essential (primary) hypertension: Secondary | ICD-10-CM | POA: Diagnosis not present

## 2021-07-17 DIAGNOSIS — Z0000 Encounter for general adult medical examination without abnormal findings: Secondary | ICD-10-CM | POA: Diagnosis not present

## 2021-07-22 ENCOUNTER — Other Ambulatory Visit: Payer: Self-pay | Admitting: Physician Assistant

## 2021-07-22 DIAGNOSIS — Z87891 Personal history of nicotine dependence: Secondary | ICD-10-CM

## 2021-07-25 ENCOUNTER — Ambulatory Visit
Admission: RE | Admit: 2021-07-25 | Discharge: 2021-07-25 | Disposition: A | Discharge status: Home / Self Care | Payer: Medicare Other | Source: Ambulatory Visit | Attending: Physician Assistant | Admitting: Physician Assistant

## 2021-07-25 DIAGNOSIS — Z87891 Personal history of nicotine dependence: Secondary | ICD-10-CM | POA: Diagnosis not present

## 2021-07-25 DIAGNOSIS — Z136 Encounter for screening for cardiovascular disorders: Secondary | ICD-10-CM | POA: Diagnosis not present

## 2021-10-03 ENCOUNTER — Ambulatory Visit: Payer: Medicare Other | Admitting: Cardiology

## 2021-10-14 ENCOUNTER — Other Ambulatory Visit: Payer: Self-pay | Admitting: Cardiology

## 2021-10-14 DIAGNOSIS — I10 Essential (primary) hypertension: Secondary | ICD-10-CM

## 2021-10-16 ENCOUNTER — Ambulatory Visit: Payer: Medicare Other | Admitting: Cardiology

## 2021-10-16 ENCOUNTER — Encounter: Payer: Self-pay | Admitting: Cardiology

## 2021-10-16 ENCOUNTER — Other Ambulatory Visit: Payer: Self-pay

## 2021-10-16 VITALS — BP 117/75 | HR 61 | Temp 98.1°F | Resp 16 | Ht 72.0 in | Wt 232.0 lb

## 2021-10-16 DIAGNOSIS — G720 Drug-induced myopathy: Secondary | ICD-10-CM | POA: Diagnosis not present

## 2021-10-16 DIAGNOSIS — T466X5A Adverse effect of antihyperlipidemic and antiarteriosclerotic drugs, initial encounter: Secondary | ICD-10-CM

## 2021-10-16 DIAGNOSIS — I2510 Atherosclerotic heart disease of native coronary artery without angina pectoris: Secondary | ICD-10-CM

## 2021-10-16 DIAGNOSIS — I10 Essential (primary) hypertension: Secondary | ICD-10-CM | POA: Diagnosis not present

## 2021-10-16 DIAGNOSIS — E782 Mixed hyperlipidemia: Secondary | ICD-10-CM

## 2021-10-16 MED ORDER — ATORVASTATIN CALCIUM 10 MG PO TABS: 10.0000 mg | ORAL_TABLET | Freq: Every day | ORAL | 3 refills | Status: AC

## 2021-10-16 NOTE — Progress Notes (Signed)
Follow up visit  Subjective:   Cory Hunter, male    DOB: 14-Jun-1951, 70 y.o.   MRN: [id number]  Chief Complaint  Patient presents with   Coronary Artery Disease   Follow-up    70 y.o. Caucasian male  hypertension, hyperlipidemia, coronary artery disease   Patient with CTO to LAD, high-grade stenosis to large OM 2 branch, medical therapy was recommended when he had cardiac catheterization on 12/18/2020 as he was having hip surgery.  He underwent right hip surgery in September 2022 without periprocedural cardiac complication.  He now presents for 14-monthoffice visit.  He has noticed decreased exercise tolerance and dyspnea on exertion even with minimal activities.  Current Outpatient Medications on File Prior to Visit  Medication Sig Dispense Refill   acetaminophen (TYLENOL) 650 MG CR tablet Take 1,300 mg by mouth 2 (two) times daily as needed for pain.     amLODipine (NORVASC) 10 MG tablet Take 1 tablet (10 mg total) by mouth daily. 90 tablet 3   aspirin EC 81 MG tablet Take 1 tablet (81 mg total) by mouth daily. (Patient taking differently: Take 81 mg by mouth 2 (two) times daily.) 90 tablet 3   FLUoxetine (PROZAC) 40 MG capsule Take 40 mg by mouth daily.     losartan (COZAAR) 25 MG tablet Take 1 tablet by mouth once daily 90 tablet 0   Melatonin 10 MG TABS Take 10 mg by mouth at bedtime.     metoprolol succinate (TOPROL-XL) 50 MG 24 hr tablet Take 1 tablet (50 mg total) by mouth daily. Take with or immediately following a meal. 90 tablet 3   nitroGLYCERIN (NITROSTAT) 0.4 MG SL tablet Place 1 tablet (0.4 mg total) under the tongue every 5 (five) minutes as needed for chest pain. 30 tablet 3   pantoprazole (PROTONIX) 40 MG tablet Take 1 tablet by mouth once daily 90 tablet 0   REPATHA 140 MG/ML SOSY Inject 140 mg into the muscle every 14 (fourteen) days.     ZOLPIDEM TARTRATE PO Take 6.25 mg by mouth at bedtime.     No current facility-administered medications on file prior to  visit.    Cardiovascular & other pertient studies:  EKG 10/16/2021: Sinus bradycardia at rate of 57 bpm, otherwise normal EKG. no significant change from 12/18/2020.   Coronary angiography 12/18/2020: LM: Normal LAD: Ostial 30% stenosis, prox-mid 30% disease, followed by 100% mid occlusion after D2        D2 ostial 30% stenosis LCx: OM2 prox tandem 80% stenoses RCA: Prox mid 10-20% disease         Ostial PDA 30% stenosis         Grade 2 right-to-left collaterals from PDA to LAD, fills up to D3   LVEDP, LVEF normal   Severe two vessel CAD, relatively stable on medical management Will discuss medical therapy vs revascularization options and timing of upcoming hip surgery   Echocardiogram 01/16/2020:  Left ventricle cavity is normal in size. Mild concentric hypertrophy of the left ventricle.  Normal global wall motion. Normal LV systolic function  with EF 67%. Doppler evidence of grade I (impaired) diastolic dysfunction, normal LAP.  Left atrial cavity is mildly dilated.  Mild (Grade I) mitral regurgitation.  Inadequate TR jet to estimate pulmonary artery systolic pressure. Normal right atrial pressure.  Lexiscan Tetrofosmin stress test 01/16/2020: No previous exam available for comparison. Lexiscan nuclear stress test performed using 1-day protocol.  Stress EKG is non-diagnostic, as this is pharmacological  stress test.  In addition, stress EKG showed <1 mm horizontal ST depressions in inferolateral leads.  Medium sized, medium severe intensity, completely reversible perfusion defect in apical to mid anterolateral myocardium.  In addition, there is a small sized, mild intensity, reversible perfusion defect in basal inferoseptal myocardium.  Stress LVEF 52% TID index is 1.20. High risk study.    Recent labs:  Labs 07/17/2021:  Total cholesterol 182, triglycerides 161, HDL 30, LDL 123.  Non-HDL cholesterol 153.   4/12/20221: Chol 126, TG 156, HDL 27, LDL  72  10/04/2019: Glucose 99, BUN/Cr 16/0.91. eGFR 83. Na/K 140/4.2. Rest of the CMP normal.  H/H 16.1/46.8 HbA1C 5.7% Chol 262, TG 239, HDL 29, LDL 186   06/03/2017: TSH 1.24 normal   Review of Systems  Constitutional: Positive for malaise/fatigue.  Cardiovascular:  Positive for dyspnea on exertion (worsening). Negative for chest pain, leg swelling, palpitations and syncope.  Respiratory:  Positive for snoring (OSA not on CPAP).   Musculoskeletal:  Positive for back pain.    Vitals:   10/16/21 1356  BP: 117/75  Pulse: 61  Resp: 16  Temp: 98.1 F (36.7 C)  SpO2: 96%   Body mass index is 31.46 kg/m. Filed Weights   10/16/21 1356  Weight: 232 lb (105.2 kg)    Objective:   Physical Exam Vitals and nursing note reviewed.  Constitutional:      General: He is not in acute distress. Neck:     Vascular: No JVD.  Cardiovascular:     Rate and Rhythm: Normal rate and regular rhythm.     Heart sounds: Normal heart sounds. No murmur heard. Pulmonary:     Effort: Pulmonary effort is normal.     Breath sounds: Normal breath sounds. No wheezing or rales.   Assessment & Recommendations:      ICD-10-CM   1. Coronary artery disease involving native coronary artery of native heart without angina pectoris  I25.10 EKG 12-Lead    CBC    PCV ECHOCARDIOGRAM COMPLETE    2. Mixed hyperlipidemia  E78.2 atorvastatin (LIPITOR) 10 MG tablet    Lipid Panel With LDL/HDL Ratio    3. 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    Dispense:  90 tablet    Refill:  3     70 y.o. Caucasian male   hypertension, hyperlipidemia, CAD with CTO to LAD, high-grade stenosis to large OM 2 branch, medical therapy was recommended when he had cardiac catheterization on 12/18/2020 as he was having hip surgery.  He underwent right hip surgery in September 2022 without periprocedural cardiac complication.  He now presents for 7-monthoffice visit.  He has noticed decreased exercise tolerance and marked dyspnea on exertion even with minimal activities.  CAD: Normal LAD CTO, moderate stenosis in OM1, right to left collaterals from RCA to LAD. (12/2020) In view of his worsening symptoms of dyspnea and decreased exercise tolerance, we will set him up for repeat coronary angiography and possible angioplasty, lesions appear  for angioplasty. ° °Schedule for cardiac catheterization, and possible angioplasty. We discussed regarding risks, benefits, alternatives to this including stress testing, CTA and continued medical therapy. Patient wants to proceed. Understands <1-2% risk of death, stroke, MI, urgent CABG, bleeding, infection, renal failure but not limited to these.  ° °Hypertension: °Controlled ° °Hyperlipidemia:  °Statin intolerance. Continue Repatha.  His LDL had risen by PCP labs which are reviewed, however patient had discontinued taking Repatha and is back on Repatha now.  He will need repeat lipid profile testing, this will be ordered along with precardiac catheterization orders.  °He is now willing to restart Lipitor at a very low dose.  He has had statin intolerance to multiple statins in the past.  If he does not tolerate 10 mg, he can reduce it to 5 mg daily. ° °OSA presently not on CPAP. °I have discussed with him regarding the importance of treatment of OSA.  He appears fatigued.  Effect of sleep disordered breathing on hypertension, hyperlipidemia, coronary disease, arrhythmias discussed in detail, he is now motivated to obtaining CPAP machine.  It appears the sleep study was done by [facility], will request PCP to reinitiate the efforts of obtaining CPAP. ° ° °Jay Ganji, MD, FACC °10/16/2021, 2:45 PM °Office: [phone] °Fax: [phone] °Pager: [phone]  °

## 2021-10-25 ENCOUNTER — Ambulatory Visit: Payer: Medicare Other

## 2021-10-25 ENCOUNTER — Other Ambulatory Visit: Payer: Self-pay

## 2021-10-25 DIAGNOSIS — I2510 Atherosclerotic heart disease of native coronary artery without angina pectoris: Secondary | ICD-10-CM | POA: Diagnosis not present

## 2021-10-25 DIAGNOSIS — I10 Essential (primary) hypertension: Secondary | ICD-10-CM | POA: Diagnosis not present

## 2021-10-25 DIAGNOSIS — E782 Mixed hyperlipidemia: Secondary | ICD-10-CM | POA: Diagnosis not present

## 2021-10-25 DIAGNOSIS — R768 Other specified abnormal immunological findings in serum: Secondary | ICD-10-CM | POA: Diagnosis not present

## 2021-10-26 LAB — CMP14+EGFR
ALT: 13 IU/L (ref 0–44)
AST: 14 IU/L (ref 0–40)
Albumin/Globulin Ratio: 1.8 (ref 1.2–2.2)
Albumin: 4.4 g/dL (ref 3.8–4.8)
Alkaline Phosphatase: 137 IU/L — ABNORMAL HIGH (ref 44–121)
BUN/Creatinine Ratio: 12 (ref 10–24)
BUN: 15 mg/dL (ref 8–27)
Bilirubin Total: 0.4 mg/dL (ref 0.0–1.2)
CO2: 25 mmol/L (ref 20–29)
Calcium: 9.1 mg/dL (ref 8.6–10.2)
Chloride: 99 mmol/L (ref 96–106)
Creatinine, Ser: 1.28 mg/dL — ABNORMAL HIGH (ref 0.76–1.27)
Globulin, Total: 2.5 g/dL (ref 1.5–4.5)
Glucose: 110 mg/dL — ABNORMAL HIGH (ref 70–99)
Potassium: 4.4 mmol/L (ref 3.5–5.2)
Sodium: 138 mmol/L (ref 134–144)
Total Protein: 6.9 g/dL (ref 6.0–8.5)
eGFR: 61 mL/min/{1.73_m2} (ref >59)

## 2021-10-26 LAB — CBC
Hematocrit: 45.2 % (ref 37.5–51.0)
Hemoglobin: 15.3 g/dL (ref 13.0–17.7)
MCH: 30.5 pg (ref 26.6–33.0)
MCHC: 33.8 g/dL (ref 31.5–35.7)
MCV: 90 fL (ref 79–97)
Platelets: 245 10*3/uL (ref 150–450)
RBC: 5.02 x10E6/uL (ref 4.14–5.80)
RDW: 13 % (ref 11.6–15.4)
WBC: 6.8 10*3/uL (ref 3.4–10.8)

## 2021-10-26 LAB — LIPID PANEL WITH LDL/HDL RATIO
Cholesterol, Total: 101 mg/dL (ref 100–199)
HDL: 26 mg/dL — ABNORMAL LOW (ref >39)
LDL Chol Calc (NIH): 50 mg/dL (ref 0–99)
LDL/HDL Ratio: 1.9 ratio (ref 0.0–3.6)
Triglycerides: 139 mg/dL (ref 0–149)
VLDL Cholesterol Cal: 25 mg/dL (ref 5–40)

## 2021-10-26 NOTE — Progress Notes (Signed)
Labs are stable for cardiac catheterization.  Lipids are well controlled.

## 2021-10-29 ENCOUNTER — Other Ambulatory Visit: Payer: Self-pay

## 2021-10-29 ENCOUNTER — Ambulatory Visit (HOSPITAL_COMMUNITY)
Admission: RE | Disposition: A | Discharge status: Home / Self Care | Payer: Self-pay | Source: Home / Self Care | Attending: Cardiology

## 2021-10-29 ENCOUNTER — Observation Stay (HOSPITAL_COMMUNITY)
Admission: RE | Admit: 2021-10-29 | Discharge: 2021-10-30 | Disposition: A | Discharge status: Home / Self Care | Payer: Medicare Other | Attending: Cardiology | Admitting: Cardiology

## 2021-10-29 ENCOUNTER — Encounter (HOSPITAL_COMMUNITY): Payer: Self-pay | Admitting: Cardiology

## 2021-10-29 DIAGNOSIS — T466X5A Adverse effect of antihyperlipidemic and antiarteriosclerotic drugs, initial encounter: Secondary | ICD-10-CM | POA: Insufficient documentation

## 2021-10-29 DIAGNOSIS — I2510 Atherosclerotic heart disease of native coronary artery without angina pectoris: Secondary | ICD-10-CM | POA: Diagnosis not present

## 2021-10-29 DIAGNOSIS — Z7982 Long term (current) use of aspirin: Secondary | ICD-10-CM | POA: Insufficient documentation

## 2021-10-29 DIAGNOSIS — E782 Mixed hyperlipidemia: Secondary | ICD-10-CM | POA: Diagnosis not present

## 2021-10-29 DIAGNOSIS — I10 Essential (primary) hypertension: Secondary | ICD-10-CM | POA: Diagnosis not present

## 2021-10-29 DIAGNOSIS — G720 Drug-induced myopathy: Secondary | ICD-10-CM | POA: Diagnosis not present

## 2021-10-29 HISTORY — PX: LEFT HEART CATH AND CORONARY ANGIOGRAPHY: CATH118249

## 2021-10-29 LAB — POCT ACTIVATED CLOTTING TIME
Activated Clotting Time: 287 s
Activated Clotting Time: 486 seconds

## 2021-10-29 SURGERY — LEFT HEART CATH AND CORONARY ANGIOGRAPHY
Anesthesia: LOCAL

## 2021-10-29 MED ORDER — SODIUM CHLORIDE 0.9% FLUSH: 3.0000 mL | INTRAVENOUS | Status: DC | PRN

## 2021-10-29 MED ORDER — METOPROLOL SUCCINATE ER 50 MG PO TB24: 50.0000 mg | ORAL_TABLET | Freq: Every day | ORAL | Status: DC

## 2021-10-29 MED ORDER — ONDANSETRON HCL 4 MG/2ML IJ SOLN: 4.0000 mg | Freq: Four times a day (QID) | INTRAMUSCULAR | Status: DC | PRN

## 2021-10-29 MED ORDER — ZOLPIDEM TARTRATE 5 MG PO TABS
5.0000 mg | ORAL_TABLET | Freq: Every evening | ORAL | Status: DC | PRN
  Administered 2021-10-29: 5 mg via ORAL
  Filled 2021-10-29: qty 1

## 2021-10-29 MED ORDER — SODIUM CHLORIDE 0.9 % WEIGHT BASED INFUSION: 1.0000 mL/kg/h | INTRAVENOUS | Status: DC

## 2021-10-29 MED ORDER — LABETALOL HCL 5 MG/ML IV SOLN: 10.0000 mg | INTRAVENOUS | Status: AC | PRN

## 2021-10-29 MED ORDER — HEPARIN SODIUM (PORCINE) 1000 UNIT/ML IJ SOLN
INTRAMUSCULAR | Status: AC
  Filled 2021-10-29: qty 10

## 2021-10-29 MED ORDER — HEPARIN SODIUM (PORCINE) 1000 UNIT/ML IJ SOLN
INTRAMUSCULAR | Status: DC | PRN
  Administered 2021-10-29: 6000 [IU] via INTRAVENOUS
  Administered 2021-10-29: 2000 [IU] via INTRAVENOUS
  Administered 2021-10-29: 5000 [IU] via INTRAVENOUS
  Administered 2021-10-29: 2000 [IU] via INTRAVENOUS

## 2021-10-29 MED ORDER — MIDAZOLAM HCL 2 MG/2ML IJ SOLN
INTRAMUSCULAR | Status: AC
  Filled 2021-10-29: qty 2

## 2021-10-29 MED ORDER — RANOLAZINE ER 500 MG PO TB12: 500.0000 mg | ORAL_TABLET | Freq: Two times a day (BID) | ORAL | 3 refills | Status: AC

## 2021-10-29 MED ORDER — FENTANYL CITRATE (PF) 100 MCG/2ML IJ SOLN
INTRAMUSCULAR | Status: DC | PRN
  Administered 2021-10-29: 50 ug via INTRAVENOUS
  Administered 2021-10-29: 25 ug via INTRAVENOUS

## 2021-10-29 MED ORDER — SODIUM CHLORIDE 0.9 % IV SOLN: 250.0000 mL | INTRAVENOUS | Status: DC | PRN

## 2021-10-29 MED ORDER — MELATONIN 5 MG PO TABS
10.0000 mg | ORAL_TABLET | Freq: Every day | ORAL | Status: DC
  Administered 2021-10-29: 10 mg via ORAL
  Filled 2021-10-29: qty 2

## 2021-10-29 MED ORDER — VERAPAMIL HCL 2.5 MG/ML IV SOLN
INTRAVENOUS | Status: AC
  Filled 2021-10-29: qty 2

## 2021-10-29 MED ORDER — LIDOCAINE HCL (PF) 1 % IJ SOLN
INTRAMUSCULAR | Status: DC | PRN
  Administered 2021-10-29: 2 mL via INTRADERMAL

## 2021-10-29 MED ORDER — SODIUM CHLORIDE 0.9% FLUSH: 3.0000 mL | Freq: Two times a day (BID) | INTRAVENOUS | Status: DC

## 2021-10-29 MED ORDER — NITROGLYCERIN 0.4 MG SL SUBL: 0.4000 mg | SUBLINGUAL_TABLET | SUBLINGUAL | Status: DC | PRN

## 2021-10-29 MED ORDER — IOHEXOL 350 MG/ML SOLN
INTRAVENOUS | Status: DC | PRN
  Administered 2021-10-29: 105 mL

## 2021-10-29 MED ORDER — HEPARIN (PORCINE) IN NACL 1000-0.9 UT/500ML-% IV SOLN
INTRAVENOUS | Status: AC
  Filled 2021-10-29: qty 1000

## 2021-10-29 MED ORDER — AMLODIPINE BESYLATE 10 MG PO TABS: 10.0000 mg | ORAL_TABLET | Freq: Every day | ORAL | Status: DC

## 2021-10-29 MED ORDER — FLUOXETINE HCL 20 MG PO CAPS: 40.0000 mg | ORAL_CAPSULE | Freq: Every day | ORAL | Status: DC

## 2021-10-29 MED ORDER — LIDOCAINE HCL (PF) 1 % IJ SOLN
INTRAMUSCULAR | Status: AC
  Filled 2021-10-29: qty 30

## 2021-10-29 MED ORDER — SODIUM CHLORIDE 0.9% FLUSH
3.0000 mL | INTRAVENOUS | Status: DC | PRN
Start: 2021-10-29 — End: 2021-10-30

## 2021-10-29 MED ORDER — MIDAZOLAM HCL 2 MG/2ML IJ SOLN
INTRAMUSCULAR | Status: DC | PRN
  Administered 2021-10-29 (×2): 1 mg via INTRAVENOUS

## 2021-10-29 MED ORDER — HYDRALAZINE HCL 20 MG/ML IJ SOLN: 10.0000 mg | INTRAMUSCULAR | Status: AC | PRN

## 2021-10-29 MED ORDER — SODIUM CHLORIDE 0.9 % IV SOLN: INTRAVENOUS | Status: AC

## 2021-10-29 MED ORDER — ASPIRIN 81 MG PO CHEW
81.0000 mg | CHEWABLE_TABLET | ORAL | Status: AC
  Administered 2021-10-29: 81 mg via ORAL
  Filled 2021-10-29: qty 1

## 2021-10-29 MED ORDER — VERAPAMIL HCL 2.5 MG/ML IV SOLN
INTRAVENOUS | Status: DC | PRN
  Administered 2021-10-29: 10 mL via INTRA_ARTERIAL

## 2021-10-29 MED ORDER — SODIUM CHLORIDE 0.9 % WEIGHT BASED INFUSION
3.0000 mL/kg/h | INTRAVENOUS | Status: AC
  Administered 2021-10-29: 3 mL/kg/h via INTRAVENOUS

## 2021-10-29 MED ORDER — ACETAMINOPHEN 325 MG PO TABS: 650.0000 mg | ORAL_TABLET | ORAL | Status: DC | PRN

## 2021-10-29 MED ORDER — FENTANYL CITRATE (PF) 100 MCG/2ML IJ SOLN
INTRAMUSCULAR | Status: AC
  Filled 2021-10-29: qty 2

## 2021-10-29 MED ORDER — SODIUM CHLORIDE 0.9% FLUSH
3.0000 mL | Freq: Two times a day (BID) | INTRAVENOUS | Status: DC
  Administered 2021-10-29: 3 mL via INTRAVENOUS

## 2021-10-29 MED ORDER — HEPARIN (PORCINE) IN NACL 1000-0.9 UT/500ML-% IV SOLN
INTRAVENOUS | Status: AC
  Filled 2021-10-29: qty 500

## 2021-10-29 SURGICAL SUPPLY — 23 items
CATH INFINITI 5 FR JL3.5 (CATHETERS) ×1 IMPLANT
CATH INFINITI 5FR MULTPACK ANG (CATHETERS) ×1 IMPLANT
CATH INFINITI JR4 5F (CATHETERS) ×1 IMPLANT
CATH LAUNCHER 6FR AL.75 (CATHETERS) ×1 IMPLANT
CATH LAUNCHER 6FR AL1 (CATHETERS) IMPLANT
CATH LAUNCHER 6FR EBU 3 (CATHETERS) ×1 IMPLANT
CATH TELEPORT (CATHETERS) ×1 IMPLANT
CATH VISTA GUIDE 6FR JL3 (CATHETERS) ×1 IMPLANT
CATH VISTA GUIDE 6FR JL3.5 (CATHETERS) ×1 IMPLANT
CATH VISTA GUIDE 6FR XBLAD3.5 (CATHETERS) ×1 IMPLANT
CATHETER LAUNCHER 6FR AL1 (CATHETERS) ×2
DEVICE RAD COMP TR BAND LRG (VASCULAR PRODUCTS) ×1 IMPLANT
GLIDESHEATH SLEND A-KIT 6F 22G (SHEATH) ×1 IMPLANT
GUIDEWIRE INQWIRE 1.5J.035X260 (WIRE) IMPLANT
INQWIRE 1.5J .035X260CM (WIRE) ×2
KIT HEART LEFT (KITS) ×2 IMPLANT
KIT HEMO VALVE WATCHDOG (MISCELLANEOUS) ×1 IMPLANT
PACK CARDIAC CATHETERIZATION (CUSTOM PROCEDURE TRAY) ×2 IMPLANT
TRANSDUCER W/STOPCOCK (MISCELLANEOUS) ×2 IMPLANT
TUBING CIL FLEX 10 FLL-RA (TUBING) ×2 IMPLANT
WIRE ASAHI MIRACLEBROS-3 300CM (WIRE) ×1 IMPLANT
WIRE ASAHI MIRACLEBROS-6 300CM (WIRE) ×1 IMPLANT
WIRE COUGAR XT STRL 300CM (WIRE) ×1 IMPLANT

## 2021-10-29 NOTE — Interval H&P Note (Signed)
History and Physical Interval Note:  10/29/2021 3:53 PM  Cory Hunter  has presented today for surgery, with the diagnosis of shortness of breath.  The various methods of treatment have been discussed with the patient and family. After consideration of risks, benefits and other options for treatment, the patient has consented to  Procedure(s): LEFT HEART CATH AND CORONARY ANGIOGRAPHY (N/A) as a surgical intervention.  The patient's history has been reviewed, patient examined, no change in status, stable for surgery.  I have reviewed the patient's chart and labs.  Questions were answered to the patient's satisfaction.    2016/2017 Appropriate Use Criteria for Coronary Revascularization Symptom Status: Ischemic Symptoms  Non-invasive Testing: High risk  If no or indeterminate stress test, FFR/iFR results in all diseased vessels: N/A  Diabetes Mellitus: No  S/P CABG: No  Antianginal therapy (number of long-acting drugs): >=2  Patient undergoing renal transplant: No  Patient undergoing percutaneous valve procedure: No  1 Vessel Disease PCI CABG  No proximal LAD involvement, No proximal left dominant LCX involvement A (8); Indication 2 M (6); Indication 2  Proximal left dominant LCX involvement A (8); Indication 5 A (8); Indication 5  Proximal LAD involvement A (8); Indication 5 A (8); Indication 5  2 Vessel Disease  No proximal LAD involvement A (8); Indication 8 A (7); Indication 8  Proximal LAD involvement A (8); Indication 11 A (8); Indication 11  3 Vessel Disease  Low disease complexity (e.g., focal stenoses, SYNTAX <=22) A (8); Indication 17 A (8); Indication 17  Intermediate or high disease complexity (e.g., SYNTAX >=23) M (6); Indication 21 A (9); Indication 21  Left Main Disease  Isolated LMCA disease: ostial or midshaft A (7); Indication 24 A (9); Indication 24  Isolated LMCA disease: bifurcation involvement M (6); Indication 25 A (9); Indication 25  LMCA ostial or midshaft,  concurrent low disease burden multivessel disease (e.g., 1-2 additional focal stenoses, SYNTAX <=22) A (7); Indication 26 A (9); Indication 26  LMCA ostial or midshaft, concurrent intermediate or high disease burden multivessel disease (e.g., 1-2 additional bifurcation stenoses, long stenoses, SYNTAX >=23) M (4); Indication 27 A (9); Indication 27  LMCA bifurcation involvement, concurrent low disease burden multivessel disease (e.g., 1-2 additional focal stenoses, SYNTAX <=22) M (6); Indication 28 A (9); Indication 28  LMCA bifurcation involvement, concurrent intermediate or high disease burden multivessel disease (e.g., 1-2 additional bifurcation stenoses, long stenoses, SYNTAX >=23) R (3); Indication 29 A (9); Indication 29     Standley Bargo J Deigo Alonso

## 2021-10-29 NOTE — H&P (Signed)
OV 10/16/2021 copied for documentation   Follow up visit  Subjective:   Cory Hunter, male    DOB: [birth year], 71 y.o.   MRN: [id number]  Chief Complaint  Patient presents with   Coronary Artery Disease   Follow-up    71 y.o. Caucasian male  hypertension, hyperlipidemia, coronary artery disease   Patient with CTO to LAD, high-grade stenosis to large OM 2 branch, medical therapy was recommended when he had cardiac catheterization on 12/18/2020 as he was having hip surgery.  He underwent right hip surgery in September 2022 without periprocedural cardiac complication.  He now presents for 62-monthoffice visit.  He has noticed decreased exercise tolerance and dyspnea on exertion even with minimal activities.  Current Outpatient Medications on File Prior to Visit  Medication Sig Dispense Refill   acetaminophen (TYLENOL) 650 MG CR tablet Take 1,300 mg by mouth 2 (two) times daily as needed for pain.     amLODipine (NORVASC) 10 MG tablet Take 1 tablet (10 mg total) by mouth daily. 90 tablet 3   aspirin EC 81 MG tablet Take 1 tablet (81 mg total) by mouth daily. (Patient taking differently: Take 81 mg by mouth 2 (two) times daily.) 90 tablet 3   FLUoxetine (PROZAC) 40 MG capsule Take 40 mg by mouth daily.     losartan (COZAAR) 25 MG tablet Take 1 tablet by mouth once daily 90 tablet 0   Melatonin 10 MG TABS Take 10 mg by mouth at bedtime.     metoprolol succinate (TOPROL-XL) 50 MG 24 hr tablet Take 1 tablet (50 mg total) by mouth daily. Take with or immediately following a meal. 90 tablet 3   nitroGLYCERIN (NITROSTAT) 0.4 MG SL tablet Place 1 tablet (0.4 mg total) under the tongue every 5 (five) minutes as needed for chest pain. 30 tablet 3   pantoprazole (PROTONIX) 40 MG tablet Take 1 tablet by mouth once daily 90 tablet 0   REPATHA 140 MG/ML SOSY Inject 140 mg into the muscle every 14 (fourteen) days.     ZOLPIDEM TARTRATE PO Take 6.25 mg by mouth at bedtime.     No current  facility-administered medications on file prior to visit.    Cardiovascular & other pertient studies:  EKG 10/16/2021: Sinus bradycardia at rate of 57 bpm, otherwise normal EKG. no significant change from 12/18/2020.   Coronary angiography 12/18/2020: LM: Normal LAD: Ostial 30% stenosis, prox-mid 30% disease, followed by 100% mid occlusion after D2        D2 ostial 30% stenosis LCx: OM2 prox tandem 80% stenoses RCA: Prox mid 10-20% disease         Ostial PDA 30% stenosis         Grade 2 right-to-left collaterals from PDA to LAD, fills up to D3   LVEDP, LVEF normal   Severe two vessel CAD, relatively stable on medical management Will discuss medical therapy vs revascularization options and timing of upcoming hip surgery   Echocardiogram 01/16/2020:  Left ventricle cavity is normal in size. Mild concentric hypertrophy of the left ventricle.  Normal global wall motion. Normal LV systolic function  with EF 67%. Doppler evidence of grade I (impaired) diastolic dysfunction, normal LAP.  Left atrial cavity is mildly dilated.  Mild (Grade I) mitral regurgitation.  Inadequate TR jet to estimate pulmonary artery systolic pressure. Normal right atrial pressure.  Lexiscan Tetrofosmin stress test 01/16/2020: No previous exam available for comparison. Lexiscan nuclear stress test performed using 1-day protocol.  Stress EKG is non-diagnostic,  as this is pharmacological stress test.  In addition, stress EKG showed <1 mm horizontal ST depressions in inferolateral leads.  Medium sized, medium severe intensity, completely reversible perfusion defect in apical to mid anterolateral myocardium.  In addition, there is a small sized, mild intensity, reversible perfusion defect in basal inferoseptal myocardium.  Stress LVEF 52% TID index is 1.20. High risk study.    Recent labs:  Labs 07/17/2021:  Total cholesterol 182, triglycerides 161, HDL 30, LDL 123.  Non-HDL cholesterol 153.   4/12/20221: Chol  126, TG 156, HDL 27, LDL 72  10/04/2019: Glucose 99, BUN/Cr 16/0.91. eGFR 83. Na/K 140/4.2. Rest of the CMP normal.  H/H 16.1/46.8 HbA1C 5.7% Chol 262, TG 239, HDL 29, LDL 186   06/03/2017: TSH 1.24 normal   Review of Systems  Constitutional: Positive for malaise/fatigue.  Cardiovascular:  Positive for dyspnea on exertion (worsening). Negative for chest pain, leg swelling, palpitations and syncope.  Respiratory:  Positive for snoring (OSA not on CPAP).   Musculoskeletal:  Positive for back pain.    Vitals:   10/16/21 1356  BP: 117/75  Pulse: 61  Resp: 16  Temp: 98.1 F (36.7 C)  SpO2: 96%   Body mass index is 31.46 kg/m. Filed Weights   10/16/21 1356  Weight: 232 lb (105.2 kg)    Objective:   Physical Exam Vitals and nursing note reviewed.  Constitutional:      General: He is not in acute distress. Neck:     Vascular: No JVD.  Cardiovascular:     Rate and Rhythm: Normal rate and regular rhythm.     Heart sounds: Normal heart sounds. No murmur heard. Pulmonary:     Effort: Pulmonary effort is normal.     Breath sounds: Normal breath sounds. No wheezing or rales.   Assessment & Recommendations:      ICD-10-CM   1. Coronary artery disease involving native coronary artery of native heart without angina pectoris  I25.10 EKG 12-Lead    CBC    PCV ECHOCARDIOGRAM COMPLETE    2. Mixed hyperlipidemia  E78.2 atorvastatin (LIPITOR) 10 MG tablet    Lipid Panel With LDL/HDL Ratio    3. 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    Dispense:  90 tablet    Refill:  3     71  y.o. Caucasian male  hypertension, hyperlipidemia, CAD with CTO to LAD, high-grade stenosis to large OM 2 branch, medical therapy was recommended when he had cardiac catheterization on 12/18/2020 as he was having hip surgery.  He underwent right hip surgery in September 2022 without periprocedural cardiac complication.  He now presents for 52-monthoffice visit.  He has noticed decreased exercise tolerance and marked dyspnea on exertion even with minimal activities.  CAD: Normal LAD CTO, moderate stenosis in OM1, right to left collaterals from RCA to LAD. (12/2020) In view of his worsening symptoms of dyspnea and decreased exercise tolerance, we will set him up for repeat coronary angiography and  possible angioplasty, lesions appear amenable for angioplasty.  Schedule for cardiac catheterization, and possible angioplasty. We discussed regarding risks, benefits, alternatives to this including stress testing, CTA and continued medical therapy. Patient wants to proceed. Understands <1-2% risk of death, stroke, MI, urgent CABG, bleeding, infection, renal failure but not limited to these.   Hypertension: Controlled  Hyperlipidemia:  Statin intolerance. Continue Repatha.  His LDL had risen by PCP labs which are reviewed, however patient had discontinued taking Repatha and is back on Repatha now.  He will need repeat lipid profile testing, this will be ordered along with precardiac catheterization orders.  He is now willing to restart Lipitor at a very low dose.  He has had statin intolerance to multiple statins in the past.  If he does not tolerate 10 mg, he can reduce it to 5 mg daily.  OSA presently not on CPAP. I have discussed with him regarding the importance of treatment of OSA.  He appears fatigued.  Effect of sleep disordered breathing on hypertension, hyperlipidemia, coronary disease, arrhythmias discussed in detail, he is now motivated to obtaining CPAP machine.  It appears the sleep study was done  by [facility], will request PCP to reinitiate the efforts of obtaining CPAP.   Adrian Prows, MD, [facility] 10/16/2021, 2:45 PM Office: [phone] Fax: [phone] Pager: [phone]

## 2021-10-30 DIAGNOSIS — I10 Essential (primary) hypertension: Secondary | ICD-10-CM | POA: Diagnosis not present

## 2021-10-30 DIAGNOSIS — G720 Drug-induced myopathy: Secondary | ICD-10-CM | POA: Diagnosis not present

## 2021-10-30 DIAGNOSIS — E782 Mixed hyperlipidemia: Secondary | ICD-10-CM | POA: Diagnosis not present

## 2021-10-30 DIAGNOSIS — T466X5A Adverse effect of antihyperlipidemic and antiarteriosclerotic drugs, initial encounter: Secondary | ICD-10-CM | POA: Diagnosis not present

## 2021-10-30 DIAGNOSIS — I2510 Atherosclerotic heart disease of native coronary artery without angina pectoris: Secondary | ICD-10-CM | POA: Diagnosis not present

## 2021-10-30 DIAGNOSIS — Z7982 Long term (current) use of aspirin: Secondary | ICD-10-CM | POA: Diagnosis not present

## 2021-10-30 LAB — CBC
HCT: 40.7 % (ref 39.0–52.0)
Hemoglobin: 13.8 g/dL (ref 13.0–17.0)
MCH: 31.5 pg (ref 26.0–34.0)
MCHC: 33.9 g/dL (ref 30.0–36.0)
MCV: 92.9 fL (ref 80.0–100.0)
Platelets: 226 10*3/uL (ref 150–400)
RBC: 4.38 MIL/uL (ref 4.22–5.81)
RDW: 13.2 % (ref 11.5–15.5)
WBC: 5.6 10*3/uL (ref 4.0–10.5)
nRBC: 0 % (ref 0.0–0.2)

## 2021-10-30 LAB — BASIC METABOLIC PANEL
Anion gap: 9 (ref 5–15)
BUN: 14 mg/dL (ref 8–23)
CO2: 25 mmol/L (ref 22–32)
Calcium: 8.5 mg/dL — ABNORMAL LOW (ref 8.9–10.3)
Chloride: 103 mmol/L (ref 98–111)
Creatinine, Ser: 1.16 mg/dL (ref 0.61–1.24)
GFR, Estimated: >60 mL/min (ref >60)
Glucose, Bld: 112 mg/dL — ABNORMAL HIGH (ref 70–99)
Potassium: 3.5 mmol/L (ref 3.5–5.1)
Sodium: 137 mmol/L (ref 135–145)

## 2021-10-30 IMAGING — US US ABDOMINAL AORTA SCREENING AAA
1 series · 14 of 19 positions shown · non-contrast
Comparison: None.

CLINICAL DATA: 70-year-old male with smoking history. Initial
screening examination.

EXAM:
US ABDOMINAL AORTA MEDICARE SCREENING
TECHNIQUE: Ultrasound examination of the abdominal aorta was performed as a
screening evaluation for abdominal aortic aneurysm.

[Series 1: us abdominal aorta screening aaa · 0.30mm/px · 14 of 19 slices shown]
[im 1/19]
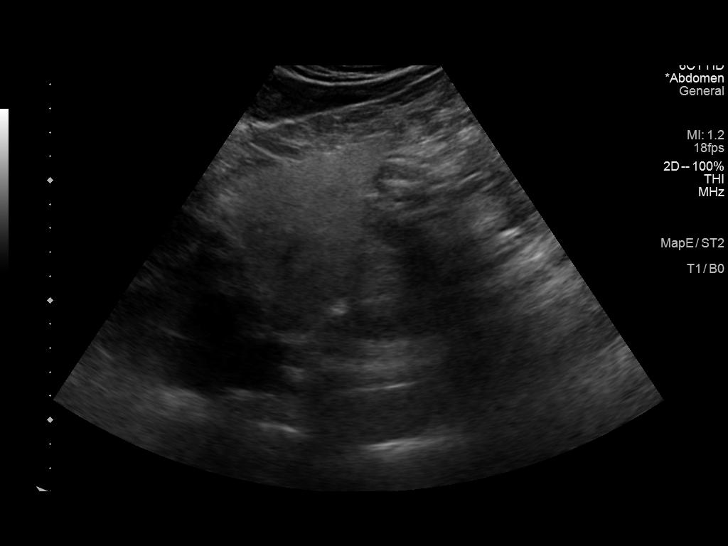
[im 3/19]
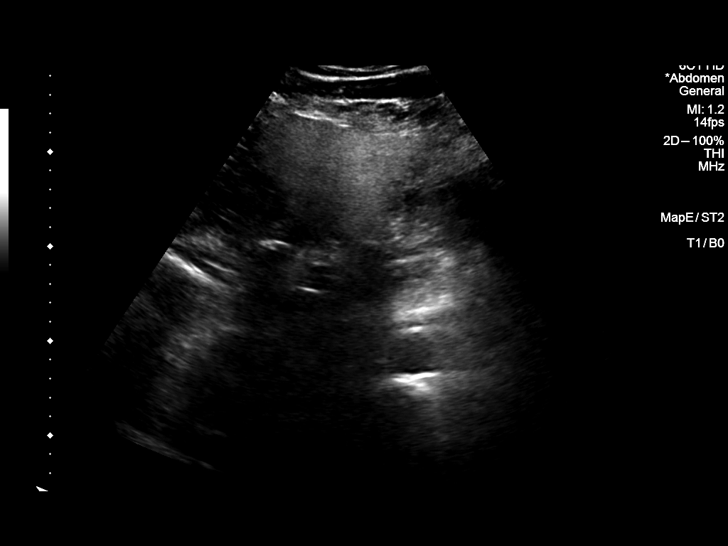
[im 4/19]
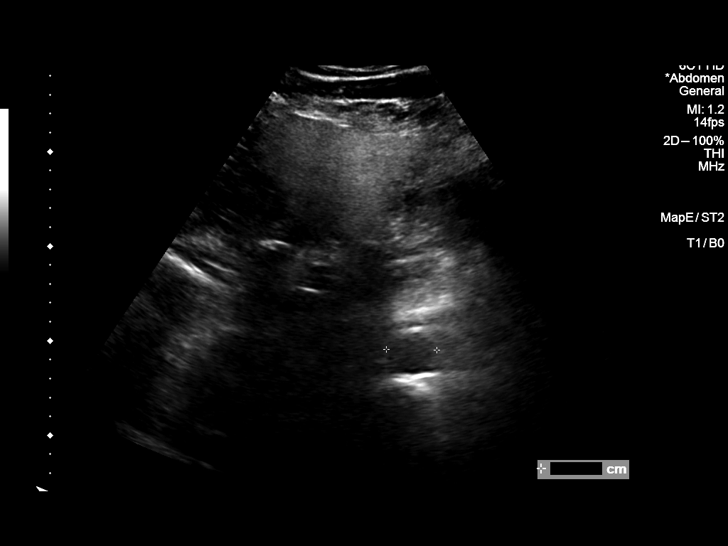
[im 5/19]
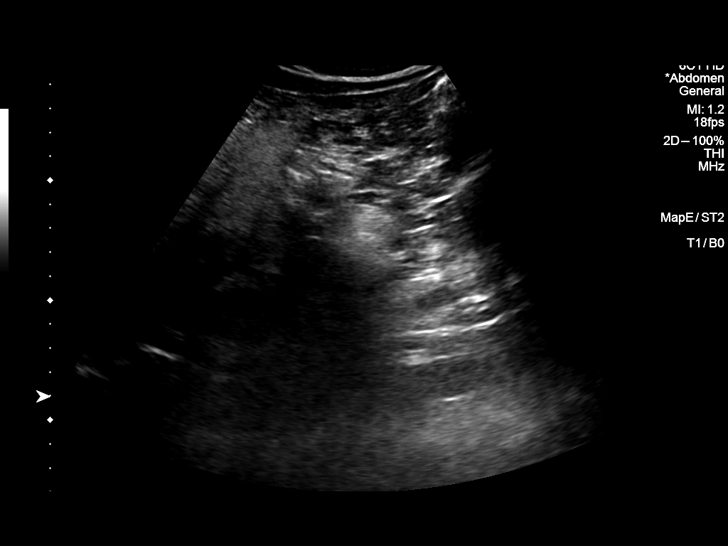
[im 7/19]
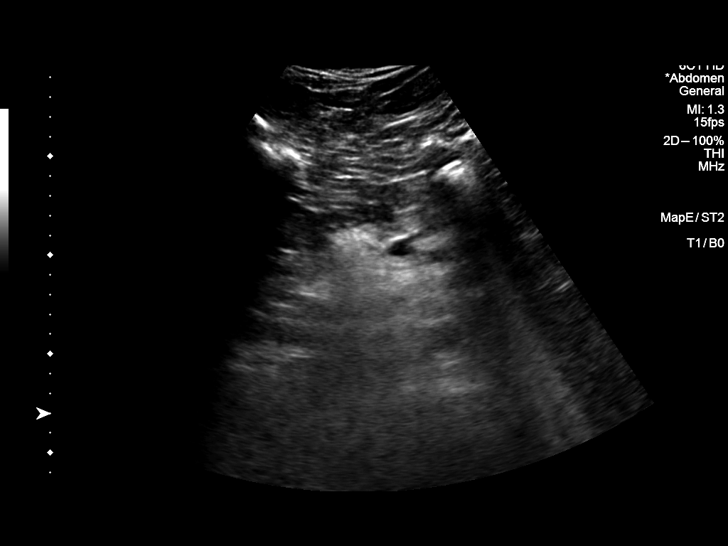
[im 8/19]
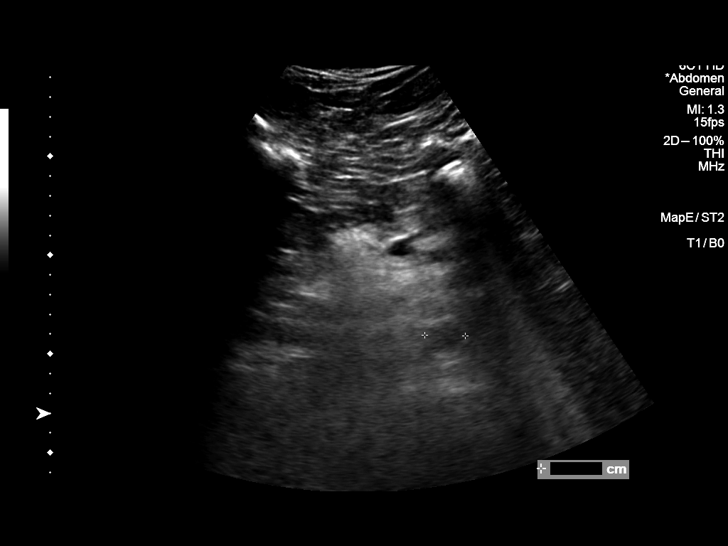
[im 9/19]
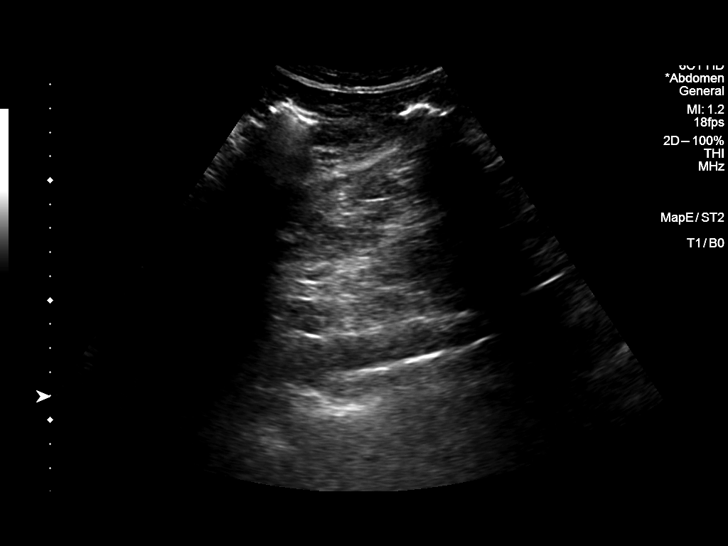
[im 11/19]
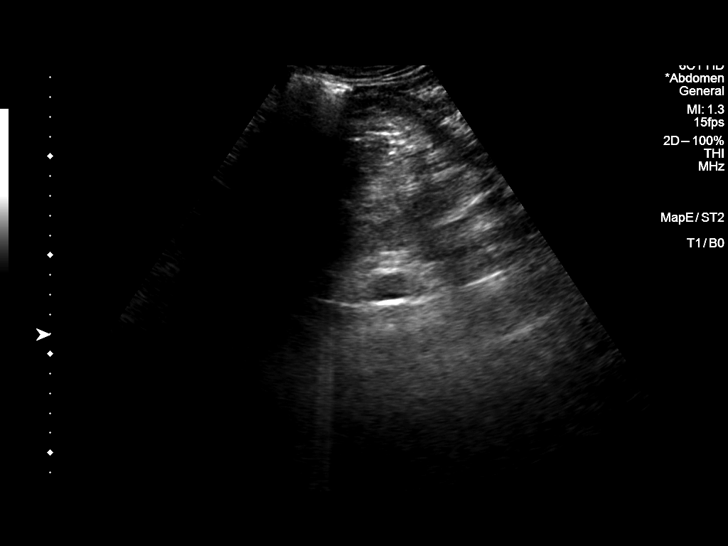
[im 12/19]
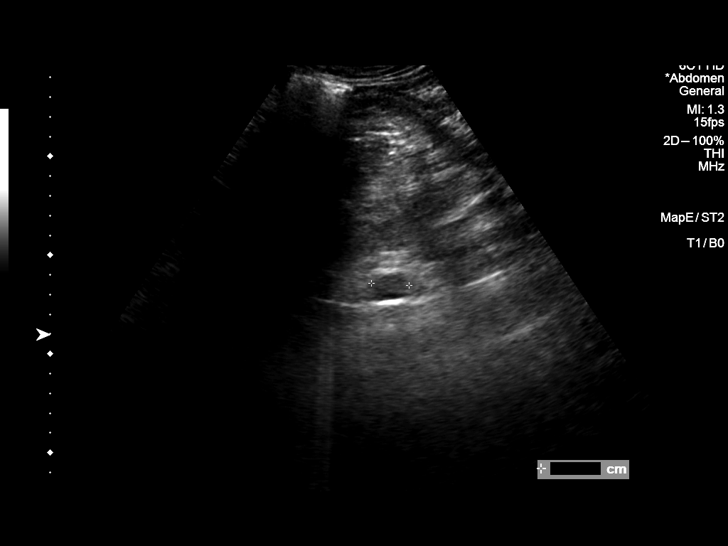
[im 13/19]
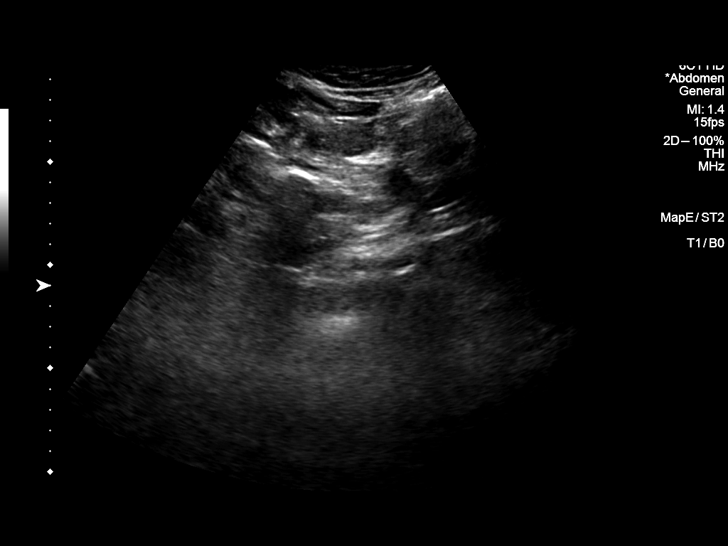
[im 15/19]
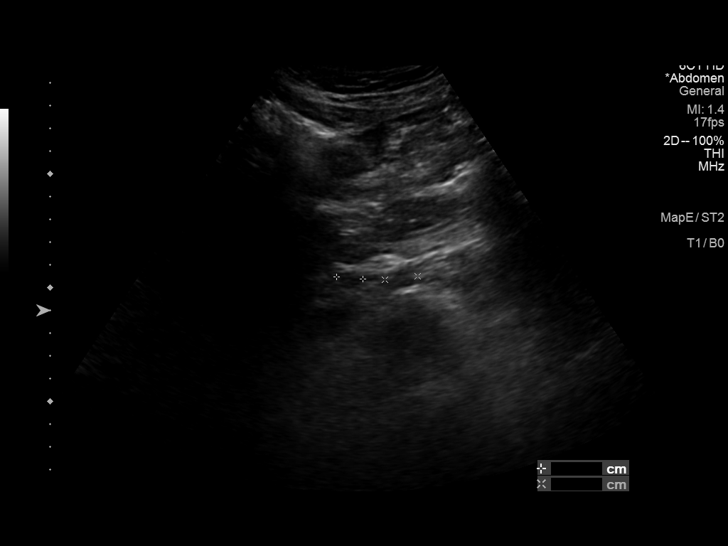
[im 16/19]
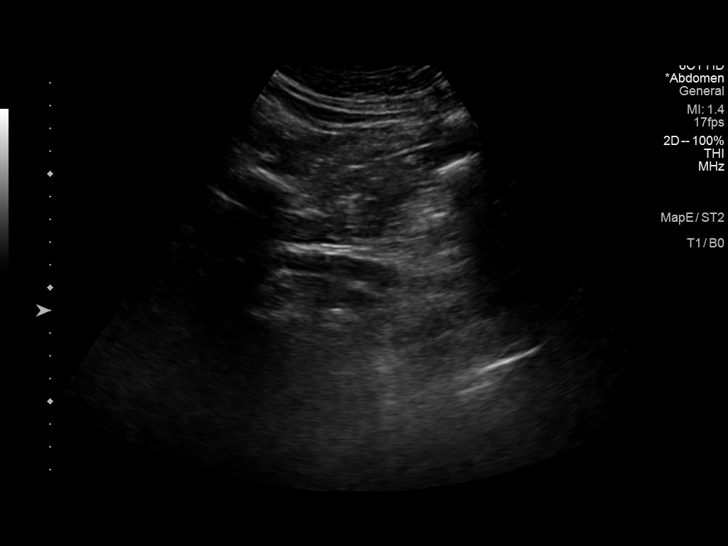
[im 17/19]
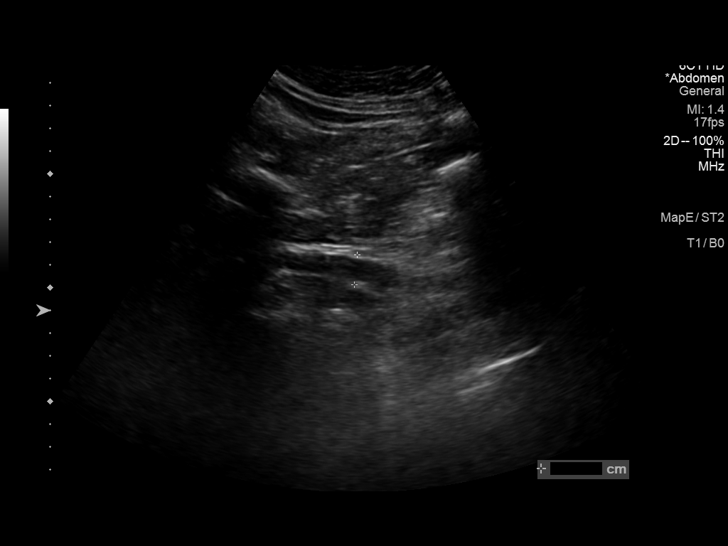
[im 19/19]
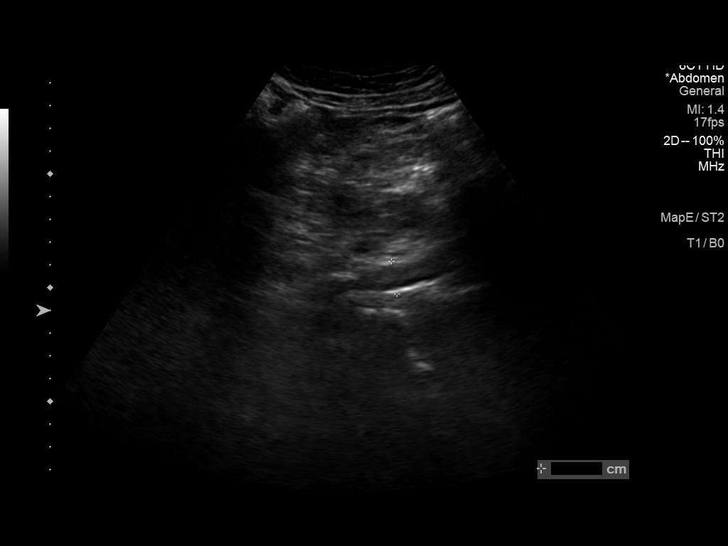

[14 of 19 positions shown; findings below may reference images not displayed]

FINDINGS: Abdominal aortic measurements as follows:

Proximal:  2.6 x 2.7 cm

Mid:  2.1 x 2.1 cm

Distal:  1.9 x 1.9 cm
IMPRESSION: No abdominal aortic aneurysm identified.

## 2021-10-30 MED FILL — Verapamil HCl IV Soln 2.5 MG/ML: INTRAVENOUS | Qty: 2 | Status: AC

## 2021-10-30 MED FILL — Heparin Sod (Porcine)-NaCl IV Soln 1000 Unit/500ML-0.9%: INTRAVENOUS | Qty: 500 | Status: AC

## 2021-10-30 MED FILL — Heparin Sod (Porcine)-NaCl IV Soln 1000 Unit/500ML-0.9%: INTRAVENOUS | Qty: 1000 | Status: AC

## 2021-10-30 NOTE — Plan of Care (Signed)
  Problem: Education: Goal: Knowledge of General Education information will improve Description: Including pain rating scale, medication(s)/side effects and non-pharmacologic comfort measures Outcome: Adequate for Discharge   

## 2021-10-30 NOTE — Plan of Care (Signed)

## 2021-10-30 NOTE — Discharge Summary (Signed)
Physician Discharge Summary  Patient ID: Cory Hunter MRN: [id number] DOB/AGE: July 11, 1951 71 y.o.  Admit date: 10/29/2021 Discharge date: 10/30/2021  Primary Discharge Diagnosis: Coronary artery disease  Secondary Discharge Diagnosis: Hypertension Mixed hyperlipidemia   Hospital Course:   71 y.o. Caucasian male  hypertension, hyperlipidemia, CAD with mLAD CTO, now with worsening exertional dyspnea, decreased exercise tolerance.  Unsuccessful mid LAD CTO PCI attempt, no complication. Added Ranexa. Follow up in office with discussion re: future repeat attempt if no improvement in symptoms  Discharge Exam: Blood pressure 123/78, pulse (!) 56, temperature 97.9 F (36.6 C), temperature source Oral, resp. rate 18, height 6' (1.829 m), weight 104.3 kg, SpO2 97 %.   Physical Exam Vitals and nursing note reviewed.  Constitutional:      General: He is not in acute distress.    Appearance: He is well-developed.  HENT:     Head: Normocephalic and atraumatic.  Eyes:     Conjunctiva/sclera: Conjunctivae normal.     Pupils: Pupils are equal, round, and reactive to light.  Neck:     Vascular: No JVD.  Cardiovascular:     Rate and Rhythm: Normal rate and regular rhythm.     Pulses: Normal pulses and intact distal pulses.     Heart sounds: No murmur heard. Pulmonary:     Effort: Pulmonary effort is normal.     Breath sounds: Normal breath sounds. No wheezing or rales.  Abdominal:     General: Bowel sounds are normal.     Palpations: Abdomen is soft.     Tenderness: There is no rebound.  Musculoskeletal:        General: No tenderness. Normal range of motion.     Right lower leg: No edema.     Left lower leg: No edema.  Lymphadenopathy:     Cervical: No cervical adenopathy.  Skin:    General: Skin is warm and dry.  Neurological:     Mental Status: He is alert and oriented to person, place, and time.     Cranial Nerves: No cranial nerve deficit.     Significant Diagnostic  Studies:  EKG 10/30/2021: Sinus rhythm Borderline first degree AV block   Left heart catheterization 10/29/2021: LM: Normal LAD: Prox moderate disease, followed by mid LAD CTO         Minimal collateralization with bridging collaterals and right-to-left collaterals Lcx: Mod OM disease RCA: Mod RPDA, RPLA disease   Attempted but unsuccessful CTO PCI to mid LAD. No complications.    Labs:   Lab Results  Component Value Date   WBC 5.6 10/30/2021   HGB 13.8 10/30/2021   HCT 40.7 10/30/2021   MCV 92.9 10/30/2021   PLT 226 10/30/2021    Recent Labs  Lab 10/25/21 0854 10/30/21 0132  NA 138 137  K 4.4 3.5  CL 99 103  CO2 25 25  BUN 15 14  CREATININE 1.28* 1.16  CALCIUM 9.1 8.5*  PROT 6.9  --   BILITOT 0.4  --   ALKPHOS 137*  --   ALT 13  --   AST 14  --   GLUCOSE 110* 112*    Lipid Panel     Component Value Date/Time   CHOL 101 10/25/2021 0854   TRIG 139 10/25/2021 0854   HDL 26 (L) 10/25/2021 0854   CHOLHDL 4.7 01/30/2020 1055   LDLCALC 50 10/25/2021 0854    BNP (last 3 results) No results for input(s): BNP in the last 8760 hours.  HEMOGLOBIN A1C  No results found for: HGBA1C, MPG  Cardiac Panel (last 3 results) No results for input(s): CKTOTAL, CKMB, TROPONINI, RELINDX in the last 8760 hours.  No results found for: CKTOTAL, CKMB, CKMBINDEX, TROPONINI   TSH No results for input(s): TSH in the last 8760 hours.  Radiology: CARDIAC CATHETERIZATION  Result Date: 10/29/2021 Images from the original result were not included. LM: Normal LAD: Prox moderate disease, followed by mid LAD CTO         Minimal collateralization with bridging collaterals and right-to-left collaterals Lcx: Mod OM disease RCA: Mod RPDA, RPLA disease Attempted but unsuccessful CTO PCI to mid LAD. No complications. Elder Negus, MD Pager: [phone] Office: [phone]  PCV ECHOCARDIOGRAM COMPLETE  Result Date: 10/27/2021 Echocardiogram 10/25/2021: Normal LV systolic function  with visual EF 55-60%. Left ventricle cavity is normal in size. Mild left ventricular hypertrophy. Normal global wall motion. Normal diastolic filling pattern, normal LAP. No significant valvular heart disease. Compared to study 01/16/2020 no significant change.      FOLLOW UP PLANS AND APPOINTMENTS Discharge Instructions     Diet - low sodium heart healthy   Complete by: As directed    Increase activity slowly   Complete by: As directed       Allergies as of 10/30/2021       Reactions   Flexeril [cyclobenzaprine] Other (See Comments)   Disoriented/mental changes/"loopy"   Hydrocodone Nausea Only   Hydrocodone-acetaminophen Nausea And Vomiting   Crestor [rosuvastatin] Other (See Comments)   myalgia        Medication List     TAKE these medications    acetaminophen 650 MG CR tablet Commonly known as: TYLENOL Take 1,300 mg by mouth 2 (two) times daily as needed for pain.   amLODipine 10 MG tablet Commonly known as: NORVASC Take 1 tablet (10 mg total) by mouth daily.   aspirin EC 81 MG tablet Take 1 tablet (81 mg total) by mouth daily. What changed: when to take this   atorvastatin 10 MG tablet Commonly known as: LIPITOR Take 1 tablet (10 mg total) by mouth daily. What changed: when to take this   FLUoxetine 40 MG capsule Commonly known as: PROZAC Take 40 mg by mouth daily.   losartan 25 MG tablet Commonly known as: COZAAR Take 1 tablet by mouth once daily   Melatonin 10 MG Tabs Take 10 mg by mouth at bedtime.   metoprolol succinate 50 MG 24 hr tablet Commonly known as: TOPROL-XL Take 1 tablet (50 mg total) by mouth daily. Take with or immediately following a meal. What changed: when to take this   nitroGLYCERIN 0.4 MG SL tablet Commonly known as: NITROSTAT Place 1 tablet (0.4 mg total) under the tongue every 5 (five) minutes as needed for chest pain.   pantoprazole 40 MG tablet Commonly known as: PROTONIX Take 1 tablet by mouth once daily    ranolazine 500 MG 12 hr tablet Commonly known as: Ranexa Take 1 tablet (500 mg total) by mouth 2 (two) times daily.   Repatha 140 MG/ML Sosy Generic drug: Evolocumab Inject 140 mg into the muscle every 14 (fourteen) days.   zolpidem 6.25 MG CR tablet Commonly known as: AMBIEN CR Take 6.25 mg by mouth at bedtime.        Follow-up Information     Elder Negus, MD Follow up on 11/27/2021.   Specialties: Cardiology, Radiology Why: 2:15 PM Contact information: [address] [place] [address] [phone]  Nigel Mormon, MD Pager: [phone] Office: [phone]

## 2021-11-13 ENCOUNTER — Other Ambulatory Visit: Payer: Self-pay | Admitting: Cardiology

## 2021-11-13 DIAGNOSIS — I208 Other forms of angina pectoris: Secondary | ICD-10-CM

## 2021-11-18 DIAGNOSIS — I2510 Atherosclerotic heart disease of native coronary artery without angina pectoris: Secondary | ICD-10-CM | POA: Diagnosis not present

## 2021-11-19 ENCOUNTER — Other Ambulatory Visit: Payer: Self-pay | Admitting: Cardiology

## 2021-11-19 DIAGNOSIS — I10 Essential (primary) hypertension: Secondary | ICD-10-CM

## 2021-11-27 ENCOUNTER — Ambulatory Visit: Payer: Medicare Other | Admitting: Cardiology

## 2021-12-06 ENCOUNTER — Other Ambulatory Visit: Payer: Self-pay | Admitting: Cardiology

## 2021-12-06 DIAGNOSIS — I208 Other forms of angina pectoris: Secondary | ICD-10-CM

## 2021-12-11 DIAGNOSIS — I2510 Atherosclerotic heart disease of native coronary artery without angina pectoris: Secondary | ICD-10-CM | POA: Diagnosis not present

## 2021-12-12 DIAGNOSIS — R0602 Shortness of breath: Secondary | ICD-10-CM | POA: Diagnosis not present

## 2021-12-12 DIAGNOSIS — Z888 Allergy status to other drugs, medicaments and biological substances status: Secondary | ICD-10-CM | POA: Diagnosis not present

## 2021-12-12 DIAGNOSIS — Z79899 Other long term (current) drug therapy: Secondary | ICD-10-CM | POA: Diagnosis not present

## 2021-12-12 DIAGNOSIS — Z96649 Presence of unspecified artificial hip joint: Secondary | ICD-10-CM | POA: Diagnosis not present

## 2021-12-12 DIAGNOSIS — K219 Gastro-esophageal reflux disease without esophagitis: Secondary | ICD-10-CM | POA: Diagnosis not present

## 2021-12-12 DIAGNOSIS — Z87891 Personal history of nicotine dependence: Secondary | ICD-10-CM | POA: Diagnosis not present

## 2021-12-12 DIAGNOSIS — Z885 Allergy status to narcotic agent status: Secondary | ICD-10-CM | POA: Diagnosis not present

## 2021-12-12 DIAGNOSIS — Z7982 Long term (current) use of aspirin: Secondary | ICD-10-CM | POA: Diagnosis not present

## 2021-12-12 DIAGNOSIS — M79602 Pain in left arm: Secondary | ICD-10-CM | POA: Diagnosis not present

## 2021-12-12 DIAGNOSIS — I2510 Atherosclerotic heart disease of native coronary artery without angina pectoris: Secondary | ICD-10-CM | POA: Diagnosis not present

## 2021-12-12 DIAGNOSIS — M79601 Pain in right arm: Secondary | ICD-10-CM | POA: Diagnosis not present

## 2021-12-12 DIAGNOSIS — I10 Essential (primary) hypertension: Secondary | ICD-10-CM | POA: Diagnosis not present

## 2021-12-12 DIAGNOSIS — E785 Hyperlipidemia, unspecified: Secondary | ICD-10-CM | POA: Diagnosis not present

## 2021-12-12 DIAGNOSIS — I25119 Atherosclerotic heart disease of native coronary artery with unspecified angina pectoris: Secondary | ICD-10-CM | POA: Diagnosis not present

## 2021-12-12 DIAGNOSIS — I2582 Chronic total occlusion of coronary artery: Secondary | ICD-10-CM | POA: Diagnosis not present

## 2021-12-12 DIAGNOSIS — I208 Other forms of angina pectoris: Secondary | ICD-10-CM | POA: Diagnosis not present

## 2021-12-25 DIAGNOSIS — E785 Hyperlipidemia, unspecified: Secondary | ICD-10-CM | POA: Diagnosis not present

## 2021-12-25 DIAGNOSIS — I25110 Atherosclerotic heart disease of native coronary artery with unstable angina pectoris: Secondary | ICD-10-CM | POA: Diagnosis not present

## 2021-12-25 DIAGNOSIS — I2510 Atherosclerotic heart disease of native coronary artery without angina pectoris: Secondary | ICD-10-CM | POA: Diagnosis not present

## 2021-12-25 DIAGNOSIS — Z87891 Personal history of nicotine dependence: Secondary | ICD-10-CM | POA: Diagnosis not present

## 2021-12-25 DIAGNOSIS — I10 Essential (primary) hypertension: Secondary | ICD-10-CM | POA: Diagnosis not present

## 2021-12-25 DIAGNOSIS — I25118 Atherosclerotic heart disease of native coronary artery with other forms of angina pectoris: Secondary | ICD-10-CM | POA: Diagnosis not present

## 2021-12-25 DIAGNOSIS — I2582 Chronic total occlusion of coronary artery: Secondary | ICD-10-CM | POA: Diagnosis not present

## 2021-12-26 DIAGNOSIS — I2510 Atherosclerotic heart disease of native coronary artery without angina pectoris: Secondary | ICD-10-CM | POA: Diagnosis not present

## 2022-01-15 DIAGNOSIS — G4733 Obstructive sleep apnea (adult) (pediatric): Secondary | ICD-10-CM | POA: Diagnosis not present

## 2022-01-22 DIAGNOSIS — I10 Essential (primary) hypertension: Secondary | ICD-10-CM | POA: Diagnosis not present

## 2022-01-22 DIAGNOSIS — F419 Anxiety disorder, unspecified: Secondary | ICD-10-CM | POA: Diagnosis not present

## 2022-01-22 DIAGNOSIS — E785 Hyperlipidemia, unspecified: Secondary | ICD-10-CM | POA: Diagnosis not present

## 2022-01-22 DIAGNOSIS — G4700 Insomnia, unspecified: Secondary | ICD-10-CM | POA: Diagnosis not present

## 2022-01-30 DIAGNOSIS — R9439 Abnormal result of other cardiovascular function study: Secondary | ICD-10-CM | POA: Diagnosis not present

## 2022-01-30 DIAGNOSIS — I10 Essential (primary) hypertension: Secondary | ICD-10-CM | POA: Diagnosis not present

## 2022-01-30 DIAGNOSIS — E785 Hyperlipidemia, unspecified: Secondary | ICD-10-CM | POA: Diagnosis not present

## 2022-01-30 DIAGNOSIS — G4733 Obstructive sleep apnea (adult) (pediatric): Secondary | ICD-10-CM | POA: Diagnosis not present

## 2022-01-30 DIAGNOSIS — I208 Other forms of angina pectoris: Secondary | ICD-10-CM | POA: Diagnosis not present

## 2022-02-15 DIAGNOSIS — G4733 Obstructive sleep apnea (adult) (pediatric): Secondary | ICD-10-CM | POA: Diagnosis not present

## 2022-02-19 DIAGNOSIS — M79601 Pain in right arm: Secondary | ICD-10-CM | POA: Diagnosis not present

## 2022-02-19 DIAGNOSIS — G4733 Obstructive sleep apnea (adult) (pediatric): Secondary | ICD-10-CM | POA: Diagnosis not present

## 2022-02-19 DIAGNOSIS — I25110 Atherosclerotic heart disease of native coronary artery with unstable angina pectoris: Secondary | ICD-10-CM | POA: Diagnosis not present

## 2022-02-19 DIAGNOSIS — E785 Hyperlipidemia, unspecified: Secondary | ICD-10-CM | POA: Diagnosis not present

## 2022-02-19 DIAGNOSIS — Z96649 Presence of unspecified artificial hip joint: Secondary | ICD-10-CM | POA: Diagnosis not present

## 2022-02-19 DIAGNOSIS — M79602 Pain in left arm: Secondary | ICD-10-CM | POA: Diagnosis not present

## 2022-02-19 DIAGNOSIS — I10 Essential (primary) hypertension: Secondary | ICD-10-CM | POA: Diagnosis not present

## 2022-02-19 DIAGNOSIS — Z87891 Personal history of nicotine dependence: Secondary | ICD-10-CM | POA: Diagnosis not present

## 2022-02-19 DIAGNOSIS — Z7982 Long term (current) use of aspirin: Secondary | ICD-10-CM | POA: Diagnosis not present

## 2022-02-20 DIAGNOSIS — I2510 Atherosclerotic heart disease of native coronary artery without angina pectoris: Secondary | ICD-10-CM | POA: Diagnosis not present

## 2022-03-19 DIAGNOSIS — G4700 Insomnia, unspecified: Secondary | ICD-10-CM | POA: Diagnosis not present

## 2022-03-19 DIAGNOSIS — I10 Essential (primary) hypertension: Secondary | ICD-10-CM | POA: Diagnosis not present

## 2022-03-19 DIAGNOSIS — E785 Hyperlipidemia, unspecified: Secondary | ICD-10-CM | POA: Diagnosis not present

## 2022-03-24 DIAGNOSIS — I2510 Atherosclerotic heart disease of native coronary artery without angina pectoris: Secondary | ICD-10-CM | POA: Diagnosis not present

## 2022-03-24 DIAGNOSIS — I10 Essential (primary) hypertension: Secondary | ICD-10-CM | POA: Diagnosis not present

## 2022-03-24 DIAGNOSIS — E785 Hyperlipidemia, unspecified: Secondary | ICD-10-CM | POA: Diagnosis not present

## 2022-03-24 DIAGNOSIS — G4700 Insomnia, unspecified: Secondary | ICD-10-CM | POA: Diagnosis not present

## 2022-03-25 ENCOUNTER — Telehealth: Payer: Self-pay

## 2022-03-25 NOTE — Telephone Encounter (Signed)
Cory Hunter from [name]:  Called about a few complaints reported by patient and patients wife.  Patient is having some issues such as severe arm pain when he takes his atorvastatin.   Also, continues having dizziness with the lower dose of Metoprolol and after stopping Losartan. Patient's wife was unable to get BP readings during the time he felt dizzy at home because patient would not allow her to do so. Patient only reported that his BP was normal, but had no documentation to show that.   Patient is only taking his Ranolazine in the evening because he thinks it may be contributing to his symptoms.   Please call her is you have any other questions or concerns at :  [phone]

## 2022-03-25 NOTE — Telephone Encounter (Signed)
Please arrange an appt with me sometime this week.  Thanks MJP

## 2022-03-28 ENCOUNTER — Ambulatory Visit: Payer: Medicare Other | Admitting: Cardiology

## 2022-04-03 DIAGNOSIS — G4733 Obstructive sleep apnea (adult) (pediatric): Secondary | ICD-10-CM | POA: Diagnosis not present

## 2022-05-03 DIAGNOSIS — G4733 Obstructive sleep apnea (adult) (pediatric): Secondary | ICD-10-CM | POA: Diagnosis not present

## 2022-06-03 DIAGNOSIS — G4733 Obstructive sleep apnea (adult) (pediatric): Secondary | ICD-10-CM | POA: Diagnosis not present

## 2022-07-07 DIAGNOSIS — L821 Other seborrheic keratosis: Secondary | ICD-10-CM | POA: Diagnosis not present

## 2022-07-07 DIAGNOSIS — D2261 Melanocytic nevi of right upper limb, including shoulder: Secondary | ICD-10-CM | POA: Diagnosis not present

## 2022-07-07 DIAGNOSIS — D225 Melanocytic nevi of trunk: Secondary | ICD-10-CM | POA: Diagnosis not present

## 2022-07-07 DIAGNOSIS — D2272 Melanocytic nevi of left lower limb, including hip: Secondary | ICD-10-CM | POA: Diagnosis not present

## 2022-07-28 DIAGNOSIS — H43813 Vitreous degeneration, bilateral: Secondary | ICD-10-CM | POA: Diagnosis not present

## 2022-08-04 DIAGNOSIS — B351 Tinea unguium: Secondary | ICD-10-CM | POA: Diagnosis not present

## 2022-08-04 DIAGNOSIS — Z79899 Other long term (current) drug therapy: Secondary | ICD-10-CM | POA: Diagnosis not present

## 2022-09-15 DIAGNOSIS — Z1211 Encounter for screening for malignant neoplasm of colon: Secondary | ICD-10-CM | POA: Diagnosis not present

## 2022-09-15 DIAGNOSIS — I2510 Atherosclerotic heart disease of native coronary artery without angina pectoris: Secondary | ICD-10-CM | POA: Diagnosis not present

## 2022-09-18 DIAGNOSIS — I10 Essential (primary) hypertension: Secondary | ICD-10-CM | POA: Diagnosis not present

## 2022-09-18 DIAGNOSIS — I2510 Atherosclerotic heart disease of native coronary artery without angina pectoris: Secondary | ICD-10-CM | POA: Diagnosis not present

## 2022-09-18 DIAGNOSIS — I2089 Other forms of angina pectoris: Secondary | ICD-10-CM | POA: Diagnosis not present

## 2022-09-26 DIAGNOSIS — H25013 Cortical age-related cataract, bilateral: Secondary | ICD-10-CM | POA: Diagnosis not present

## 2022-09-26 DIAGNOSIS — H25043 Posterior subcapsular polar age-related cataract, bilateral: Secondary | ICD-10-CM | POA: Diagnosis not present

## 2022-09-26 DIAGNOSIS — H2513 Age-related nuclear cataract, bilateral: Secondary | ICD-10-CM | POA: Diagnosis not present

## 2022-09-26 DIAGNOSIS — H18413 Arcus senilis, bilateral: Secondary | ICD-10-CM | POA: Diagnosis not present

## 2022-10-27 DIAGNOSIS — F419 Anxiety disorder, unspecified: Secondary | ICD-10-CM | POA: Diagnosis not present

## 2022-10-27 DIAGNOSIS — G4700 Insomnia, unspecified: Secondary | ICD-10-CM | POA: Diagnosis not present

## 2022-10-27 DIAGNOSIS — Z0000 Encounter for general adult medical examination without abnormal findings: Secondary | ICD-10-CM | POA: Diagnosis not present

## 2022-10-27 DIAGNOSIS — R7301 Impaired fasting glucose: Secondary | ICD-10-CM | POA: Diagnosis not present

## 2022-10-27 DIAGNOSIS — H9311 Tinnitus, right ear: Secondary | ICD-10-CM | POA: Diagnosis not present

## 2022-10-27 DIAGNOSIS — E785 Hyperlipidemia, unspecified: Secondary | ICD-10-CM | POA: Diagnosis not present

## 2022-10-27 DIAGNOSIS — Z1331 Encounter for screening for depression: Secondary | ICD-10-CM | POA: Diagnosis not present

## 2022-10-27 DIAGNOSIS — G4733 Obstructive sleep apnea (adult) (pediatric): Secondary | ICD-10-CM | POA: Diagnosis not present

## 2022-10-27 DIAGNOSIS — I2510 Atherosclerotic heart disease of native coronary artery without angina pectoris: Secondary | ICD-10-CM | POA: Diagnosis not present

## 2022-10-27 DIAGNOSIS — R69 Illness, unspecified: Secondary | ICD-10-CM | POA: Diagnosis not present

## 2022-10-27 DIAGNOSIS — E669 Obesity, unspecified: Secondary | ICD-10-CM | POA: Diagnosis not present

## 2022-10-27 DIAGNOSIS — I10 Essential (primary) hypertension: Secondary | ICD-10-CM | POA: Diagnosis not present

## 2022-10-27 DIAGNOSIS — Z125 Encounter for screening for malignant neoplasm of prostate: Secondary | ICD-10-CM | POA: Diagnosis not present

## 2022-11-27 DIAGNOSIS — I2510 Atherosclerotic heart disease of native coronary artery without angina pectoris: Secondary | ICD-10-CM | POA: Diagnosis not present

## 2022-11-27 DIAGNOSIS — R7303 Prediabetes: Secondary | ICD-10-CM | POA: Diagnosis not present

## 2022-11-27 DIAGNOSIS — R69 Illness, unspecified: Secondary | ICD-10-CM | POA: Diagnosis not present

## 2022-12-17 DIAGNOSIS — H2511 Age-related nuclear cataract, right eye: Secondary | ICD-10-CM | POA: Diagnosis not present

## 2022-12-18 DIAGNOSIS — H2512 Age-related nuclear cataract, left eye: Secondary | ICD-10-CM | POA: Diagnosis not present

## 2023-02-11 DIAGNOSIS — L821 Other seborrheic keratosis: Secondary | ICD-10-CM | POA: Diagnosis not present

## 2023-02-11 DIAGNOSIS — L723 Sebaceous cyst: Secondary | ICD-10-CM | POA: Diagnosis not present

## 2023-02-11 DIAGNOSIS — B351 Tinea unguium: Secondary | ICD-10-CM | POA: Diagnosis not present

## 2023-02-11 DIAGNOSIS — L820 Inflamed seborrheic keratosis: Secondary | ICD-10-CM | POA: Diagnosis not present

## 2023-02-25 DIAGNOSIS — G4700 Insomnia, unspecified: Secondary | ICD-10-CM | POA: Diagnosis not present

## 2023-02-25 DIAGNOSIS — R7303 Prediabetes: Secondary | ICD-10-CM | POA: Diagnosis not present

## 2023-02-25 DIAGNOSIS — I2510 Atherosclerotic heart disease of native coronary artery without angina pectoris: Secondary | ICD-10-CM | POA: Diagnosis not present

## 2023-02-25 DIAGNOSIS — F5104 Psychophysiologic insomnia: Secondary | ICD-10-CM | POA: Diagnosis not present

## 2023-02-25 DIAGNOSIS — E785 Hyperlipidemia, unspecified: Secondary | ICD-10-CM | POA: Diagnosis not present

## 2023-02-25 DIAGNOSIS — Z0000 Encounter for general adult medical examination without abnormal findings: Secondary | ICD-10-CM | POA: Diagnosis not present

## 2023-02-25 DIAGNOSIS — I10 Essential (primary) hypertension: Secondary | ICD-10-CM | POA: Diagnosis not present

## 2023-02-25 DIAGNOSIS — G4733 Obstructive sleep apnea (adult) (pediatric): Secondary | ICD-10-CM | POA: Diagnosis not present

## 2023-02-25 DIAGNOSIS — F419 Anxiety disorder, unspecified: Secondary | ICD-10-CM | POA: Diagnosis not present

## 2023-03-19 DIAGNOSIS — R079 Chest pain, unspecified: Secondary | ICD-10-CM | POA: Diagnosis not present

## 2023-07-30 DIAGNOSIS — M25511 Pain in right shoulder: Secondary | ICD-10-CM | POA: Diagnosis not present

## 2023-07-30 DIAGNOSIS — M25552 Pain in left hip: Secondary | ICD-10-CM | POA: Diagnosis not present

## 2023-08-13 DIAGNOSIS — R739 Hyperglycemia, unspecified: Secondary | ICD-10-CM | POA: Diagnosis not present

## 2023-08-13 DIAGNOSIS — R0609 Other forms of dyspnea: Secondary | ICD-10-CM | POA: Diagnosis not present

## 2023-08-13 DIAGNOSIS — I10 Essential (primary) hypertension: Secondary | ICD-10-CM | POA: Diagnosis not present

## 2023-08-13 DIAGNOSIS — I2510 Atherosclerotic heart disease of native coronary artery without angina pectoris: Secondary | ICD-10-CM | POA: Diagnosis not present

## 2023-08-15 DIAGNOSIS — R0609 Other forms of dyspnea: Secondary | ICD-10-CM | POA: Diagnosis not present

## 2023-08-15 DIAGNOSIS — R739 Hyperglycemia, unspecified: Secondary | ICD-10-CM | POA: Diagnosis not present

## 2023-08-15 DIAGNOSIS — I2510 Atherosclerotic heart disease of native coronary artery without angina pectoris: Secondary | ICD-10-CM | POA: Diagnosis not present

## 2023-08-15 DIAGNOSIS — I10 Essential (primary) hypertension: Secondary | ICD-10-CM | POA: Diagnosis not present

## 2023-08-27 DIAGNOSIS — M1610 Unilateral primary osteoarthritis, unspecified hip: Secondary | ICD-10-CM | POA: Diagnosis not present

## 2023-08-28 DIAGNOSIS — F419 Anxiety disorder, unspecified: Secondary | ICD-10-CM | POA: Diagnosis not present

## 2023-08-28 DIAGNOSIS — F5104 Psychophysiologic insomnia: Secondary | ICD-10-CM | POA: Diagnosis not present

## 2023-08-28 DIAGNOSIS — G4733 Obstructive sleep apnea (adult) (pediatric): Secondary | ICD-10-CM | POA: Diagnosis not present

## 2023-08-28 DIAGNOSIS — E785 Hyperlipidemia, unspecified: Secondary | ICD-10-CM | POA: Diagnosis not present

## 2023-08-28 DIAGNOSIS — I700 Atherosclerosis of aorta: Secondary | ICD-10-CM | POA: Diagnosis not present

## 2023-08-28 DIAGNOSIS — I2510 Atherosclerotic heart disease of native coronary artery without angina pectoris: Secondary | ICD-10-CM | POA: Diagnosis not present

## 2023-08-28 DIAGNOSIS — R5383 Other fatigue: Secondary | ICD-10-CM | POA: Diagnosis not present

## 2023-08-28 DIAGNOSIS — I10 Essential (primary) hypertension: Secondary | ICD-10-CM | POA: Diagnosis not present

## 2023-08-28 DIAGNOSIS — R7303 Prediabetes: Secondary | ICD-10-CM | POA: Diagnosis not present

## 2023-08-28 DIAGNOSIS — Z1211 Encounter for screening for malignant neoplasm of colon: Secondary | ICD-10-CM | POA: Diagnosis not present

## 2023-09-24 DIAGNOSIS — I2510 Atherosclerotic heart disease of native coronary artery without angina pectoris: Secondary | ICD-10-CM | POA: Diagnosis not present

## 2023-09-24 DIAGNOSIS — Z87891 Personal history of nicotine dependence: Secondary | ICD-10-CM | POA: Diagnosis not present

## 2023-09-24 DIAGNOSIS — Z79899 Other long term (current) drug therapy: Secondary | ICD-10-CM | POA: Diagnosis not present

## 2023-09-24 DIAGNOSIS — I10 Essential (primary) hypertension: Secondary | ICD-10-CM | POA: Diagnosis not present

## 2023-09-24 DIAGNOSIS — R739 Hyperglycemia, unspecified: Secondary | ICD-10-CM | POA: Diagnosis not present

## 2023-09-24 DIAGNOSIS — I119 Hypertensive heart disease without heart failure: Secondary | ICD-10-CM | POA: Diagnosis not present

## 2023-09-24 DIAGNOSIS — Z7902 Long term (current) use of antithrombotics/antiplatelets: Secondary | ICD-10-CM | POA: Diagnosis not present

## 2023-09-24 DIAGNOSIS — Z7982 Long term (current) use of aspirin: Secondary | ICD-10-CM | POA: Diagnosis not present

## 2023-09-24 DIAGNOSIS — E785 Hyperlipidemia, unspecified: Secondary | ICD-10-CM | POA: Diagnosis not present

## 2023-09-24 DIAGNOSIS — M1612 Unilateral primary osteoarthritis, left hip: Secondary | ICD-10-CM | POA: Diagnosis not present

## 2023-09-24 DIAGNOSIS — I25118 Atherosclerotic heart disease of native coronary artery with other forms of angina pectoris: Secondary | ICD-10-CM | POA: Diagnosis not present

## 2023-09-24 DIAGNOSIS — G4733 Obstructive sleep apnea (adult) (pediatric): Secondary | ICD-10-CM | POA: Diagnosis not present

## 2023-10-04 DIAGNOSIS — I2510 Atherosclerotic heart disease of native coronary artery without angina pectoris: Secondary | ICD-10-CM | POA: Diagnosis not present

## 2023-10-04 DIAGNOSIS — I10 Essential (primary) hypertension: Secondary | ICD-10-CM | POA: Diagnosis not present

## 2023-10-04 DIAGNOSIS — R739 Hyperglycemia, unspecified: Secondary | ICD-10-CM | POA: Diagnosis not present

## 2023-12-02 DIAGNOSIS — Z79899 Other long term (current) drug therapy: Secondary | ICD-10-CM | POA: Diagnosis not present

## 2023-12-02 DIAGNOSIS — M129 Arthropathy, unspecified: Secondary | ICD-10-CM | POA: Diagnosis not present

## 2024-01-04 DIAGNOSIS — Z79899 Other long term (current) drug therapy: Secondary | ICD-10-CM | POA: Diagnosis not present

## 2024-01-04 DIAGNOSIS — M1612 Unilateral primary osteoarthritis, left hip: Secondary | ICD-10-CM | POA: Diagnosis not present

## 2024-01-11 DIAGNOSIS — Z79899 Other long term (current) drug therapy: Secondary | ICD-10-CM | POA: Diagnosis not present

## 2024-02-03 DIAGNOSIS — Z6832 Body mass index (BMI) 32.0-32.9, adult: Secondary | ICD-10-CM | POA: Diagnosis not present

## 2024-02-03 DIAGNOSIS — Z79899 Other long term (current) drug therapy: Secondary | ICD-10-CM | POA: Diagnosis not present

## 2024-02-03 DIAGNOSIS — M1612 Unilateral primary osteoarthritis, left hip: Secondary | ICD-10-CM | POA: Diagnosis not present

## 2024-02-03 DIAGNOSIS — E6609 Other obesity due to excess calories: Secondary | ICD-10-CM | POA: Diagnosis not present

## 2024-04-04 DIAGNOSIS — M1612 Unilateral primary osteoarthritis, left hip: Secondary | ICD-10-CM | POA: Diagnosis not present

## 2024-04-04 DIAGNOSIS — R030 Elevated blood-pressure reading, without diagnosis of hypertension: Secondary | ICD-10-CM | POA: Diagnosis not present

## 2024-04-04 DIAGNOSIS — E6609 Other obesity due to excess calories: Secondary | ICD-10-CM | POA: Diagnosis not present

## 2024-04-04 DIAGNOSIS — Z79899 Other long term (current) drug therapy: Secondary | ICD-10-CM | POA: Diagnosis not present

## 2024-04-04 DIAGNOSIS — Z6832 Body mass index (BMI) 32.0-32.9, adult: Secondary | ICD-10-CM | POA: Diagnosis not present

## 2024-04-04 DIAGNOSIS — Z6833 Body mass index (BMI) 33.0-33.9, adult: Secondary | ICD-10-CM | POA: Diagnosis not present

## 2024-04-07 DIAGNOSIS — Z79899 Other long term (current) drug therapy: Secondary | ICD-10-CM | POA: Diagnosis not present

## 2024-04-11 DIAGNOSIS — S3994XA Unspecified injury of external genitals, initial encounter: Secondary | ICD-10-CM | POA: Diagnosis not present

## 2024-04-11 DIAGNOSIS — L539 Erythematous condition, unspecified: Secondary | ICD-10-CM | POA: Diagnosis not present

## 2024-04-12 ENCOUNTER — Other Ambulatory Visit: Payer: Self-pay | Admitting: Nurse Practitioner

## 2024-04-12 DIAGNOSIS — S3994XA Unspecified injury of external genitals, initial encounter: Secondary | ICD-10-CM

## 2024-04-13 ENCOUNTER — Other Ambulatory Visit

## 2024-07-04 DIAGNOSIS — M1612 Unilateral primary osteoarthritis, left hip: Secondary | ICD-10-CM | POA: Diagnosis not present

## 2024-07-04 DIAGNOSIS — R030 Elevated blood-pressure reading, without diagnosis of hypertension: Secondary | ICD-10-CM | POA: Diagnosis not present

## 2024-07-04 DIAGNOSIS — Z79899 Other long term (current) drug therapy: Secondary | ICD-10-CM | POA: Diagnosis not present

## 2024-07-04 DIAGNOSIS — Z6832 Body mass index (BMI) 32.0-32.9, adult: Secondary | ICD-10-CM | POA: Diagnosis not present

## 2024-07-04 DIAGNOSIS — E6609 Other obesity due to excess calories: Secondary | ICD-10-CM | POA: Diagnosis not present

## 2024-07-04 DIAGNOSIS — Z6831 Body mass index (BMI) 31.0-31.9, adult: Secondary | ICD-10-CM | POA: Diagnosis not present

## 2024-07-06 DIAGNOSIS — Z79899 Other long term (current) drug therapy: Secondary | ICD-10-CM | POA: Diagnosis not present

## 2024-09-19 DIAGNOSIS — Z955 Presence of coronary angioplasty implant and graft: Secondary | ICD-10-CM | POA: Diagnosis not present

## 2024-09-19 DIAGNOSIS — G4733 Obstructive sleep apnea (adult) (pediatric): Secondary | ICD-10-CM | POA: Diagnosis not present

## 2024-09-19 DIAGNOSIS — I10 Essential (primary) hypertension: Secondary | ICD-10-CM | POA: Diagnosis not present

## 2024-09-19 DIAGNOSIS — I25118 Atherosclerotic heart disease of native coronary artery with other forms of angina pectoris: Secondary | ICD-10-CM | POA: Diagnosis not present

## 2024-09-19 DIAGNOSIS — R5383 Other fatigue: Secondary | ICD-10-CM | POA: Diagnosis not present

## 2024-09-22 DIAGNOSIS — I2510 Atherosclerotic heart disease of native coronary artery without angina pectoris: Secondary | ICD-10-CM | POA: Diagnosis not present

## 2024-09-22 DIAGNOSIS — R0609 Other forms of dyspnea: Secondary | ICD-10-CM | POA: Diagnosis not present

## 2024-09-22 DIAGNOSIS — R42 Dizziness and giddiness: Secondary | ICD-10-CM | POA: Diagnosis not present

## 2024-09-29 DIAGNOSIS — Z87898 Personal history of other specified conditions: Secondary | ICD-10-CM | POA: Diagnosis not present

## 2024-09-29 DIAGNOSIS — Z79899 Other long term (current) drug therapy: Secondary | ICD-10-CM | POA: Diagnosis not present

## 2024-09-29 DIAGNOSIS — M545 Low back pain, unspecified: Secondary | ICD-10-CM | POA: Diagnosis not present

## 2024-09-29 DIAGNOSIS — M1612 Unilateral primary osteoarthritis, left hip: Secondary | ICD-10-CM | POA: Diagnosis not present

## 2024-09-29 DIAGNOSIS — Z1159 Encounter for screening for other viral diseases: Secondary | ICD-10-CM | POA: Diagnosis not present

## 2024-09-29 DIAGNOSIS — Z125 Encounter for screening for malignant neoplasm of prostate: Secondary | ICD-10-CM | POA: Diagnosis not present

## 2024-09-29 DIAGNOSIS — G8929 Other chronic pain: Secondary | ICD-10-CM | POA: Diagnosis not present
